# Patient Record
Sex: Female | Born: 1985
Health system: Southern US, Community
[De-identification: ages and names within clinical notes are randomized; demographics above are authoritative.]

## PROBLEM LIST (undated history)

## (undated) DIAGNOSIS — R51 Headache: Secondary | ICD-10-CM

## (undated) DIAGNOSIS — C4490 Unspecified malignant neoplasm of skin, unspecified: Secondary | ICD-10-CM

## (undated) DIAGNOSIS — F909 Attention-deficit hyperactivity disorder, unspecified type: Secondary | ICD-10-CM

## (undated) DIAGNOSIS — R519 Headache, unspecified: Secondary | ICD-10-CM

## (undated) DIAGNOSIS — T8859XA Other complications of anesthesia, initial encounter: Secondary | ICD-10-CM

## (undated) DIAGNOSIS — T4145XA Adverse effect of unspecified anesthetic, initial encounter: Secondary | ICD-10-CM

## (undated) HISTORY — PX: ADENOIDECTOMY: SHX5191

## (undated) HISTORY — DX: Unspecified malignant neoplasm of skin, unspecified: C44.90

---
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
## 2005-02-28 ENCOUNTER — Other Ambulatory Visit: Admission: RE | Admit: 2005-02-28 | Discharge: 2005-02-28 | Payer: Self-pay | Admitting: Obstetrics and Gynecology

## 2011-07-01 ENCOUNTER — Ambulatory Visit: Payer: Self-pay | Admitting: Family Medicine

## 2011-07-01 ENCOUNTER — Encounter: Payer: Self-pay | Admitting: Family

## 2011-07-01 ENCOUNTER — Ambulatory Visit (INDEPENDENT_AMBULATORY_CARE_PROVIDER_SITE_OTHER): Payer: BC Managed Care – PPO | Admitting: Family

## 2011-07-01 VITALS — BP 102/80 | HR 84 | Temp 98.3°F | Resp 16 | Ht 62.5 in | Wt 136.0 lb

## 2011-07-01 DIAGNOSIS — R55 Syncope and collapse: Secondary | ICD-10-CM

## 2011-07-01 DIAGNOSIS — R002 Palpitations: Secondary | ICD-10-CM

## 2011-07-01 DIAGNOSIS — F329 Major depressive disorder, single episode, unspecified: Secondary | ICD-10-CM | POA: Insufficient documentation

## 2011-07-01 DIAGNOSIS — F32A Depression, unspecified: Secondary | ICD-10-CM

## 2011-07-01 MED ORDER — BUPROPION HCL ER (XL) 150 MG PO TB24: 150.0000 mg | ORAL_TABLET | Freq: Every day | ORAL | Status: DC

## 2011-07-01 NOTE — Patient Instructions (Signed)
Please complete your blood work prior to leaving today.  Eat 3 well balanced meals and two snacks a day. Try to eliminate alcohol. Follow up in 1 month. Welcome to [facility]!

## 2011-07-01 NOTE — Assessment & Plan Note (Signed)
Did not tolerate zoloft due to sedation. Will give trial of Wellbutrin. She was instructed to start one tablet daily for first 3 days then increase to BID. We discussed importance of stopping alcohol and we also discussed the possibility of her attending AA meetings.  Suggested that she bring a supportive friend with her to these meetings. Hopefully, if depression can be better controlled, it will be easier for her to stop drinking.  Plan follow up in 1 month.

## 2011-07-01 NOTE — Assessment & Plan Note (Addendum)
Will obtain BMET, CBC and TSH. EKG performed in the office today is personally reviewed and it notes NSR without arrythmia.  I counseled the patient on healthy nutrition and need to eat 3 healthy meals a day.  Also, cut back on caffeine.  If recurrent episodes, will consider referral for holter monitor.  I suspect that these episodes are vasovagal in nature.

## 2011-07-01 NOTE — Progress Notes (Signed)
Subjective:    Patient ID: Kristin Sandoval, female    DOB: 11-25-1985, 26 y.o.   MRN: [id number]  HPI  Ms.  Kristin Sandoval is a 26 yr old female new to practice.  Has not had a primary care.  She has seen Dr. Edward Jolly at [facility]. She has several concerns today.    Near syncope- 12/31 went to the ED with a friend who was ill.  Had episode of palpitations, sweating. Took her about 30 minutes for her heart rate to feel like it normalized. She put her head between her knees.  Happened again last night.  She has never completely passed out before.  She denies palpitations on a regular basis.  She denies associated shortness of breath or chest pain during the episode.  She does report intermittent nausea.  She drinks coffee in the AM, then has several caffienated soft drinks later in the day. She tells me that when she was a child she used to have similar near syncopal events- often occuring in the shower.    Diet- She reports that she was as high as 160 lbs about 6 months ago.  She reports that she often skips meals.  She does pilates at home. Strength training.  No cardio.  Often skips meals,  Keeps calorie count very low- usually around a thousand calories a day.  Depression/anxiety- has been on paxil in the past.  Stopped zoloft 2 weeks ago.  Felt lethargic- was taking zoloft 25 mg every other day for 4-6 weeks.  She notes that at first it made her feel "elated."  This was prescribed by her OB/GYN. Stays up late, sleeps late.    Alcohol use- She tells me that she "drinks too much and I know it is not healthy."  She drinks Crown [place] and tells me that although she has had "some help," she has drank about 5 bottles of Crown Royal since November.  Reports that she feels guilty about her drinking.  Occasionally will come home for lunch and have a drink.  Drinks because it makes me feel happy, "less bitchy", nicer to be around.  Tells me that her father and several family members on her father's side had  history of alcoholism. She said that she "put her mind to it to stop drinking" when she started zoloft.  She was unable to stop. She has seen a therapist a few times, but she is not sure that this is helping her.    Review of Systems See HPI  Past Medical History  Diagnosis Date  . Asthma     childhood    History   Social History  . Marital Status: Single    Spouse Name: N/A    Number of Children: 0  . Years of Education: N/A   Occupational History  .  [employer]   Social History Main Topics  . Smoking status: Former Smoker    Types: Cigarettes    Quit date: 06/30/2006  . Smokeless tobacco: Not on file  . Alcohol Use: Yes     0-7 drinks weekly.  . Drug Use: No  . Sexually Active: Not on file   Other Topics Concern  . Not on file   Social History Narrative   Caffeine Use:  1 cup coffee dailyRegular exercise:  4 x weekly    Past Surgical History  Procedure Date  . Adenoidectomy     26 yrs old    Family History  Problem Relation Age of  Onset  . Alcohol abuse Father   . Arthritis Maternal Uncle   . Hyperlipidemia Maternal Uncle   . Alcohol abuse Paternal Aunt   . Arthritis Maternal Grandmother     DDD, spinal stenosis  . Hyperlipidemia Maternal Grandmother   . Alcohol abuse Maternal Grandfather   . Alcohol abuse Paternal Grandfather     No Known Allergies  No current outpatient prescriptions on file prior to visit.    BP 102/80  Pulse 84  Temp(Src) 98.3 F (36.8 C) (Oral)  Resp 16  Ht 5' 2.5" (1.588 m)  Wt 136 lb (61.689 kg)  BMI 24.48 kg/m2  LMP 07/01/2011       Objective:   Physical Exam  Constitutional: She appears well-developed and well-nourished. No distress.  HENT:  Head: Normocephalic and atraumatic.  Eyes: No scleral icterus.  Cardiovascular: Normal rate and regular rhythm.   No murmur heard. Pulmonary/Chest: Effort normal and breath sounds normal. No respiratory distress. She has no wheezes. She has no rales. She exhibits no  tenderness.  Musculoskeletal: She exhibits no edema.  Skin: Skin is warm and dry.  Psychiatric: She has a normal mood and affect. Her behavior is normal. Judgment and thought content normal.          Assessment & Plan:  45 minutes spent with pt today.  >50% of this time was spent counseling pt on her depression, nutrition and alcohol use.

## 2011-07-02 LAB — BASIC METABOLIC PANEL WITH GFR
CO2: 25 mEq/L (ref 19–32)
Calcium: 9.4 mg/dL (ref 8.4–10.5)
GFR, Est African American: >89 mL/min
Glucose, Bld: 74 mg/dL (ref 70–99)
Potassium: 4.3 mEq/L (ref 3.5–5.3)
Sodium: 138 mEq/L (ref 135–145)

## 2011-07-02 LAB — CBC WITH DIFFERENTIAL/PLATELET
Hemoglobin: 13.3 g/dL (ref 12.0–15.0)
Lymphocytes Relative: 36 % (ref 12–46)
Lymphs Abs: 3.4 10*3/uL (ref 0.7–4.0)
MCH: 30 pg (ref 26.0–34.0)
Monocytes Relative: 7 % (ref 3–12)
Neutrophils Relative %: 54 % (ref 43–77)
Platelets: 293 10*3/uL (ref 150–400)
RBC: 4.43 MIL/uL (ref 3.87–5.11)
WBC: 9.5 10*3/uL (ref 4.0–10.5)

## 2011-07-02 LAB — TSH: TSH: 1.539 u[IU]/mL (ref 0.350–4.500)

## 2011-07-04 ENCOUNTER — Encounter: Payer: Self-pay | Admitting: Family

## 2011-07-21 ENCOUNTER — Telehealth: Payer: Self-pay | Admitting: *Deleted

## 2011-07-21 NOTE — Telephone Encounter (Signed)
Pt called stating she has felt nauseous x 5-6 days.  Feels jittery, thought it was coming from some cold med she took.  Pt has been very stressed, has not been eating. Denies suicidal thoughts.  Anxiety attacks are not as intense since starting Wellbutrin. Pt had a lot of sinus drainage that seems to be improving.  LMP 07/01/11. Has GYN appt tomorrow morning. Pt wants to know if this can be a side effect of the Wellbutrin or what do you think could be causing the nausea? Pt can be reached tomorrow after noon or we can leave a detailed message on her cell.

## 2011-07-22 NOTE — Telephone Encounter (Signed)
Nausea could be due to Wellbutrin, "jittery feeling" more likely from cold med.  I would like to see her back in the office please.

## 2011-07-22 NOTE — Telephone Encounter (Signed)
Left detailed message on cell voicemail and to call to schedule appt with Melissa.

## 2011-07-27 NOTE — Telephone Encounter (Signed)
Attempted to reach pt to check on her status and received recording that voice mailbox was full. Encounter is being closed.

## 2011-09-19 ENCOUNTER — Telehealth: Payer: Self-pay | Admitting: Family

## 2011-09-19 MED ORDER — BUPROPION HCL ER (XL) 150 MG PO TB24: 150.0000 mg | ORAL_TABLET | Freq: Every day | ORAL | Status: DC

## 2011-09-19 NOTE — Telephone Encounter (Signed)
Will send 30 day supply of bupropion until pt is seen for follow up in the office as pt is past due for follow up. Attempted to notify pt and reached message that voice mailbox is full and cannot accept messages. 30 day supply sent to pharmacy x no refills.

## 2011-09-19 NOTE — Telephone Encounter (Signed)
90 day prescription request  Bupropion hcl xl 150mg  tablet.

## 2011-09-20 ENCOUNTER — Ambulatory Visit (INDEPENDENT_AMBULATORY_CARE_PROVIDER_SITE_OTHER): Payer: BC Managed Care – PPO | Admitting: Family

## 2011-09-20 ENCOUNTER — Encounter: Payer: Self-pay | Admitting: Family

## 2011-09-20 VITALS — BP 118/78 | HR 96 | Temp 98.1°F | Resp 16 | Ht 62.5 in | Wt 129.0 lb

## 2011-09-20 DIAGNOSIS — F329 Major depressive disorder, single episode, unspecified: Secondary | ICD-10-CM

## 2011-09-20 DIAGNOSIS — F3289 Other specified depressive episodes: Secondary | ICD-10-CM

## 2011-09-20 DIAGNOSIS — R55 Syncope and collapse: Secondary | ICD-10-CM

## 2011-09-20 DIAGNOSIS — R41840 Attention and concentration deficit: Secondary | ICD-10-CM

## 2011-09-20 DIAGNOSIS — F909 Attention-deficit hyperactivity disorder, unspecified type: Secondary | ICD-10-CM | POA: Insufficient documentation

## 2011-09-20 DIAGNOSIS — F32A Depression, unspecified: Secondary | ICD-10-CM

## 2011-09-20 MED ORDER — BUPROPION HCL ER (XL) 150 MG PO TB24: 150.0000 mg | ORAL_TABLET | Freq: Every day | ORAL | Status: DC

## 2011-09-20 NOTE — Patient Instructions (Signed)
You will be contacted about your referral to ADD testing.  Follow up 1-2 weeks after your appointment with psychology for ADD testing.

## 2011-09-20 NOTE — Progress Notes (Signed)
  Subjective:    Patient ID: Kristin Sandoval, female    DOB: 11/21/1985, 26 y.o.   MRN: [id number]  HPI  Kristin Sandoval is a 26 yr old female who presents today for follow up.   1) Depression- reports that her mood is greatly improved on the Wellbutrin.  Tells me that she is no longer craving alcohol.  Used to drink excessively nearly every day.  Now only drinking on occasion and generally in social settings.  She is happy about this improvement.  Had nausea initially, but this has resolved.  2) Concentration problems-  Notes trouble focusing especially at work or when reading.  Reports that she was diagnosed with ADD as a child but was never treated.  Feels that these symptoms have intensified since she started on Wellbutrin.   3) Dizziness/palpatiation- reports resolution of dizziness.  She reports reports 2 incidents of brief palpitations since her last visit.    Review of Systems See HPI  Past Medical History  Diagnosis Date  . Asthma     childhood    History   Social History  . Marital Status: Single    Spouse Name: N/A    Number of Children: 0  . Years of Education: N/A   Occupational History  .  [employer]   Social History Main Topics  . Smoking status: Former Smoker    Types: Cigarettes    Quit date: 06/30/2006  . Smokeless tobacco: Not on file  . Alcohol Use: Yes     0-7 drinks weekly.  . Drug Use: No  . Sexually Active: Not on file   Other Topics Concern  . Not on file   Social History Narrative   Caffeine Use:  1 cup coffee dailyRegular exercise:  4 x weekly    Past Surgical History  Procedure Date  . Adenoidectomy     26 yrs old    Family History  Problem Relation Age of Onset  . Alcohol abuse Father   . Arthritis Maternal Uncle   . Hyperlipidemia Maternal Uncle   . Alcohol abuse Paternal Aunt   . Arthritis Maternal Grandmother     DDD, spinal stenosis  . Hyperlipidemia Maternal Grandmother   . Alcohol abuse Maternal Grandfather   . Alcohol abuse  Paternal Grandfather     No Known Allergies  Current Outpatient Prescriptions on File Prior to Visit  Medication Sig Dispense Refill  . ORTHO TRI-CYCLEN LO 0.18/0.215/0.25 MG-25 MCG tablet Take 1 tablet by mouth daily.      Marland Kitchen DISCONTD: buPROPion (WELLBUTRIN XL) 150 MG 24 hr tablet Take 1 tablet (150 mg total) by mouth daily.  30 tablet  0    BP 118/78  Pulse 96  Temp(Src) 98.1 F (36.7 C) (Oral)  Resp 16  Ht 5' 2.5" (1.588 m)  Wt 129 lb (58.514 kg)  BMI 23.22 kg/m2  SpO2 99%       Objective:   Physical Exam  Constitutional: She appears well-developed and well-nourished. No distress.  Psychiatric: She has a normal mood and affect. Her behavior is normal. Judgment and thought content normal.          Assessment & Plan:  25 minutes spent with pt.  >50% of this time was spent counseling pt on her depression/possible ADD.

## 2011-09-20 NOTE — Assessment & Plan Note (Signed)
I commended her on cutting back on her alcohol.  Depression is improved.  Still some mild anxiety at times.  Will continue current dose of wellbutrin.

## 2011-09-20 NOTE — Assessment & Plan Note (Signed)
I suspect Adult ADD.  Will send her for a formal evaluation.

## 2011-09-20 NOTE — Assessment & Plan Note (Signed)
No further symptoms. Resolved.  I suspect rare palpitations are anxiety related.  EKG, TSH, CBC, BMET all normal last visit. Monitor.

## 2011-10-03 ENCOUNTER — Encounter: Payer: Self-pay | Admitting: Family

## 2011-10-03 ENCOUNTER — Telehealth: Payer: Self-pay | Admitting: Family

## 2011-10-03 NOTE — Telephone Encounter (Signed)
Pls call pt and let her know that have reviewed her consultation with psychology and they believe that she does have ADHD.  She should schedule a follow up appointment at her convenience for further treatment discussion.

## 2011-10-04 NOTE — Telephone Encounter (Signed)
Attempted to reach pt and was unable to leave message as voice mailbox was full. Letter mailed to pt.

## 2011-10-12 ENCOUNTER — Ambulatory Visit (INDEPENDENT_AMBULATORY_CARE_PROVIDER_SITE_OTHER): Payer: BC Managed Care – PPO | Admitting: Family

## 2011-10-12 ENCOUNTER — Encounter: Payer: Self-pay | Admitting: Family

## 2011-10-12 VITALS — BP 100/78 | HR 88 | Temp 98.2°F | Resp 16 | Ht 62.5 in | Wt 131.0 lb

## 2011-10-12 DIAGNOSIS — F909 Attention-deficit hyperactivity disorder, unspecified type: Secondary | ICD-10-CM

## 2011-10-12 MED ORDER — AMPHETAMINE-DEXTROAMPHET ER 20 MG PO CP24: 20.0000 mg | ORAL_CAPSULE | ORAL | Status: DC

## 2011-10-12 NOTE — Progress Notes (Signed)
  Subjective:    Patient ID: Kristin Sandoval, female    DOB: [birth year], 26 y.o.   MRN: [id number]  HPI  Ms.  Kristin Sandoval is a 26 yr old female who presents today for follow up. Had formal evaluation 09/28/11 At [facility]- findings suggestive of ADHD. She reports that she continues to have difficulty concentrating at work and this frustrates her.  Feels like it may contribute to her mood and irritability.    She continues to feel that her depression is improved on wellbutrin.     Review of Systems     Objective:   Physical Exam  Constitutional: She appears well-developed and well-nourished. No distress.  Psychiatric: She has a normal mood and affect. Her behavior is normal. Judgment and thought content normal.          Assessment & Plan:

## 2011-10-12 NOTE — Patient Instructions (Signed)
Please schedule a follow up appointment in 1 month.

## 2011-10-13 ENCOUNTER — Telehealth: Payer: Self-pay | Admitting: *Deleted

## 2011-10-13 NOTE — Telephone Encounter (Signed)
Received form from [facility] for prior auth on pt's adderall XR rx. Form completed and forwarded to Provider for signature.

## 2011-10-13 NOTE — Assessment & Plan Note (Signed)
Trial of Adderall.  Pt was advised that this medication has the potential for abuse.  We discussed proper use of medication.  Controlled substance contract was signed and common side effects discussed.  Follow up in 1 month.  15 minutes spent with the patient today.  >50 % of this time was spent counseling pt on her ADHD and treatment.

## 2011-10-14 NOTE — Telephone Encounter (Signed)
Form faxed to [facility] [phone]. Awaiting approval / denial status. Attempted to notify pt and reached recording that voice mail box cannot accept messages.

## 2011-10-14 NOTE — Telephone Encounter (Signed)
Received approval from 10/14/11 through 10/12/12. Notified pharmacy and pt.

## 2011-10-14 NOTE — Telephone Encounter (Signed)
Notified pt of prior auth process and that we will contact her and the pharmacy once we have received the ins. Determination.

## 2011-10-14 NOTE — Telephone Encounter (Signed)
Signed.

## 2011-11-07 ENCOUNTER — Encounter: Payer: Self-pay | Admitting: Family

## 2011-11-07 ENCOUNTER — Ambulatory Visit (INDEPENDENT_AMBULATORY_CARE_PROVIDER_SITE_OTHER): Payer: BC Managed Care – PPO | Admitting: Family

## 2011-11-07 VITALS — BP 110/70 | HR 115 | Temp 98.3°F | Resp 16 | Ht 62.5 in | Wt 122.1 lb

## 2011-11-07 DIAGNOSIS — F32A Depression, unspecified: Secondary | ICD-10-CM

## 2011-11-07 DIAGNOSIS — F909 Attention-deficit hyperactivity disorder, unspecified type: Secondary | ICD-10-CM

## 2011-11-07 DIAGNOSIS — F329 Major depressive disorder, single episode, unspecified: Secondary | ICD-10-CM

## 2011-11-07 MED ORDER — AMPHETAMINE-DEXTROAMPHET ER 20 MG PO CP24: 20.0000 mg | ORAL_CAPSULE | ORAL | Status: DC

## 2011-11-07 MED ORDER — BUPROPION HCL 75 MG PO TABS: ORAL_TABLET | ORAL | Status: DC

## 2011-11-07 NOTE — Assessment & Plan Note (Signed)
Improved. I am concerned about her weight loss and have asked the pt to continue to monitor her weight closely at home and let me know if she has any further weight loss. Cont. Current dose of adderall. 35 minutes spent with pt today. Greater than 50 percent of this time was spent counseling pt on her depression and adhd.

## 2011-11-07 NOTE — Patient Instructions (Signed)
Please follow up in 1 month.  

## 2011-11-07 NOTE — Progress Notes (Signed)
  Subjective:    Patient ID: Kristin Sandoval, female    DOB: 1985-07-05, 26 y.o.   MRN: [id number]  HPI Kristin Sandoval is a 26 year old female who presents today for follow up of her adhd. She reports that the first week that she started adderral, she felt very flat. Noted anorexia. She has lost 11 pounds since her last visit. She feels like her appetite is slowly improving. Reports that last week she was very productive at work. Thinks she is better able to prioritize.  Depression- she continues the wellbutrin. Has nearly discontinued alcohol. She is now seeing a therapist which she thinks is helping. Less stress in her personal life. She wants to discontinue wellbutrin. Review of Systems See hpi    Objective:   Physical Exam  Constitutional: She appears well-developed and well-nourished.  Psychiatric: She has a normal mood and affect. Her behavior is normal. Judgment and thought content normal.          Assessment & Plan:

## 2011-11-26 ENCOUNTER — Other Ambulatory Visit: Payer: Self-pay | Admitting: Family

## 2011-11-28 NOTE — Telephone Encounter (Signed)
Pt wanted to stop bupropion at last visit. Is she requesting refill? Unable to reach pt or leave message on cell #. Left message on work # to return my call.

## 2011-11-29 NOTE — Telephone Encounter (Signed)
Unable to leave message on home/cell#, left message on work # to return my call.

## 2011-11-30 NOTE — Telephone Encounter (Signed)
Bupropion refill denied. Pt has not returned my calls.

## 2011-11-30 NOTE — Telephone Encounter (Signed)
Ok to refuse refill.  She is weaning off.

## 2011-12-27 ENCOUNTER — Ambulatory Visit (INDEPENDENT_AMBULATORY_CARE_PROVIDER_SITE_OTHER): Payer: BC Managed Care – PPO | Admitting: Family

## 2011-12-27 ENCOUNTER — Encounter: Payer: Self-pay | Admitting: Family

## 2011-12-27 VITALS — BP 108/68 | HR 83 | Temp 98.3°F | Resp 16 | Ht 62.5 in | Wt 120.0 lb

## 2011-12-27 DIAGNOSIS — F329 Major depressive disorder, single episode, unspecified: Secondary | ICD-10-CM

## 2011-12-27 DIAGNOSIS — F32A Depression, unspecified: Secondary | ICD-10-CM

## 2011-12-27 DIAGNOSIS — F909 Attention-deficit hyperactivity disorder, unspecified type: Secondary | ICD-10-CM

## 2011-12-27 MED ORDER — AMPHETAMINE-DEXTROAMPHET ER 20 MG PO CP24: 20.0000 mg | ORAL_CAPSULE | ORAL | Status: DC

## 2011-12-27 NOTE — Progress Notes (Signed)
  Subjective:    Patient ID: Kristin Sandoval, female    DOB: 1986/05/06, 26 y.o.   MRN: [id number]  HPI  Kristin Sandoval is a 26 yr old female who presents today for follow up of her depression.  Last visit she discontinued wellbutrin due to an improvement in her symptoms and weight loss.  She reports that since discontinuing wellbutrin, she has felt "happy."  When asked about alcohol consumption, she notes that she has started drinking a little more since she stopped the medication.  She has not been drinking during the week, but has gone out socially a few times and drank quite a bit.    ADHD- reports that she is feeling better.  Feels like the Adderall is working. Still has to force herself to eat as the adderall has curbed her appetite.   Review of Systems See HPI  Past Medical History  Diagnosis Date  . Asthma     childhood    History   Social History  . Marital Status: Single    Spouse Name: N/A    Number of Children: 0  . Years of Education: N/A   Occupational History  .  [employer]   Social History Main Topics  . Smoking status: Former Smoker    Types: Cigarettes    Quit date: 06/30/2006  . Smokeless tobacco: Not on file  . Alcohol Use: Yes     0-7 drinks weekly.  . Drug Use: No  . Sexually Active: Not on file   Other Topics Concern  . Not on file   Social History Narrative   Caffeine Use:  1 cup coffee dailyRegular exercise:  4 x weekly    Past Surgical History  Procedure Date  . Adenoidectomy     26 yrs old    Family History  Problem Relation Age of Onset  . Alcohol abuse Father   . Arthritis Maternal Uncle   . Hyperlipidemia Maternal Uncle   . Alcohol abuse Paternal Aunt   . Arthritis Maternal Grandmother     DDD, spinal stenosis  . Hyperlipidemia Maternal Grandmother   . Alcohol abuse Maternal Grandfather   . Alcohol abuse Paternal Grandfather     Allergies  Allergen Reactions  . Acrylic Polymer (Carbomer)     Loses nail  . Adhesive (Tape) Rash    . Mango Flavor Rash    Current Outpatient Prescriptions on File Prior to Visit  Medication Sig Dispense Refill  . ORTHO TRI-CYCLEN LO 0.18/0.215/0.25 MG-25 MCG tablet Take 1 tablet by mouth daily.      Marland Kitchen DISCONTD: amphetamine-dextroamphetamine (ADDERALL XR) 20 MG 24 hr capsule Take 1 capsule (20 mg total) by mouth every morning.  30 capsule  0    BP 108/68  Pulse 83  Temp 98.3 F (36.8 C) (Oral)  Resp 16  Ht 5' 2.5" (1.588 m)  Wt 120 lb (54.432 kg)  BMI 21.60 kg/m2  SpO2 99%       Objective:   Physical Exam  Constitutional: She appears well-developed and well-nourished. No distress.  Psychiatric: She has a normal mood and affect. Her behavior is normal. Judgment and thought content normal.          Assessment & Plan:

## 2011-12-27 NOTE — Assessment & Plan Note (Signed)
Improved on adderall. She will keep a close eye on her weight and call me if she develops any further weight loss.  She may wish to try strattera instead at some point and will let me know.

## 2011-12-27 NOTE — Patient Instructions (Addendum)
Please schedule a follow up appointment in 3 months.

## 2011-12-27 NOTE — Assessment & Plan Note (Signed)
Seems stable.  Weight has improved off of Wellbutrin.  We did discuss the importance of limiting her alcohol intake and she verbalizes understanding.

## 2012-01-30 ENCOUNTER — Telehealth: Payer: Self-pay | Admitting: *Deleted

## 2012-01-30 MED ORDER — AMPHETAMINE-DEXTROAMPHET ER 20 MG PO CP24: 20.0000 mg | ORAL_CAPSULE | ORAL | Status: DC

## 2012-01-30 NOTE — Telephone Encounter (Signed)
Adderall rx last printed on 12/27/11. Pt will be due for follow up in October and is requesting refill. Rx printed and forwarded to Provider for signature.

## 2012-01-30 NOTE — Telephone Encounter (Signed)
Signed.

## 2012-01-30 NOTE — Telephone Encounter (Signed)
Rx given to pt. 

## 2012-03-06 ENCOUNTER — Telehealth: Payer: Self-pay | Admitting: *Deleted

## 2012-03-06 MED ORDER — AMPHETAMINE-DEXTROAMPHET ER 20 MG PO CP24: 20.0000 mg | ORAL_CAPSULE | ORAL | Status: DC

## 2012-03-06 NOTE — Telephone Encounter (Signed)
Signed.

## 2012-03-06 NOTE — Telephone Encounter (Signed)
Rx placed at front desk for pick up, pt notified

## 2012-03-06 NOTE — Telephone Encounter (Signed)
Pt left message requesting refill of Adderall XR. Rx last printed on 01/30/12 and pt will be due for follow up in October. Rx printed and forwarded to Provider for signature.

## 2012-04-09 ENCOUNTER — Other Ambulatory Visit: Payer: Self-pay | Admitting: *Deleted

## 2012-04-09 MED ORDER — AMPHETAMINE-DEXTROAMPHET ER 20 MG PO CP24: 20.0000 mg | ORAL_CAPSULE | ORAL | Status: DC

## 2012-04-09 NOTE — Telephone Encounter (Signed)
Informed patient of that Rx has been printed and sent to provider for signature. Patient did schedule a follow up for 04/10/12

## 2012-04-09 NOTE — Telephone Encounter (Signed)
Pt left message requesting refill of Adderall. Rx last printed 03/06/12 and pt is due for follow up this month. Rx printed and forwarded to PRovider for signature. Please call pt to arrange f/u as further refills will not be provided until pt is seen.

## 2012-04-10 ENCOUNTER — Telehealth: Payer: Self-pay | Admitting: *Deleted

## 2012-04-10 ENCOUNTER — Encounter: Payer: Self-pay | Admitting: Family

## 2012-04-10 ENCOUNTER — Ambulatory Visit (INDEPENDENT_AMBULATORY_CARE_PROVIDER_SITE_OTHER): Payer: BC Managed Care – PPO | Admitting: Family

## 2012-04-10 VITALS — BP 100/70 | HR 101 | Temp 98.7°F | Resp 16 | Ht 62.5 in | Wt 123.1 lb

## 2012-04-10 DIAGNOSIS — F3289 Other specified depressive episodes: Secondary | ICD-10-CM

## 2012-04-10 DIAGNOSIS — K649 Unspecified hemorrhoids: Secondary | ICD-10-CM

## 2012-04-10 DIAGNOSIS — F32A Depression, unspecified: Secondary | ICD-10-CM

## 2012-04-10 DIAGNOSIS — F909 Attention-deficit hyperactivity disorder, unspecified type: Secondary | ICD-10-CM

## 2012-04-10 DIAGNOSIS — F329 Major depressive disorder, single episode, unspecified: Secondary | ICD-10-CM

## 2012-04-10 MED ORDER — ATOMOXETINE HCL 40 MG PO CAPS: ORAL_CAPSULE | ORAL | Status: DC

## 2012-04-10 MED ORDER — HYDROCORTISONE ACE-PRAMOXINE 1-1 % RE FOAM: 1.0000 | Freq: Two times a day (BID) | RECTAL | Status: DC

## 2012-04-10 NOTE — Patient Instructions (Addendum)
Try to keep stools soft. Drink 6-8 glasses of water a day. You may try miralax one cap in water once daily or add a fiber supplement such as metamucil wafers once daily.  Please schedule a follow up appointment in 1 month.

## 2012-04-10 NOTE — Telephone Encounter (Signed)
Left message on cell# to return my call. Will give rx to pt at f/u this afternoon.

## 2012-04-10 NOTE — Assessment & Plan Note (Signed)
Stop adderall, start strattera.

## 2012-04-10 NOTE — Telephone Encounter (Signed)
Returned call to pt. Advised pt that no matter where she takes her rx, insurance will require authorization. Pt asked if she could go ahead and refill her Adderal rx until authorization comes through for the Strattera. Asked Melissa and she said yes. Notified pt ok to refill the Adderal.

## 2012-04-10 NOTE — Progress Notes (Signed)
Subjective:    Patient ID: Kristin Sandoval, female    DOB: 13-May-1986, 26 y.o.   MRN: [id number]  HPI  Kristin Sandoval is a 26 yr old female who presents today for follow up.  1) Depression- she reports that she has had some anxiety which she relates to "man problems." Otherwise has been doing well.  2) ADHD- reports that the adderall is helping her to have concentration with work and school.  She continues to have issues with anorexia while taking.  She is interested in switching to strattera.  3) Hemorrhoids- reports that she has struggled with hemorrhoids "for a while." She has used otc preps with minimal relief.  Reports that her stool is often hard and she knows that she does not drink enough water.    Review of Systems See HPI  Past Medical History  Diagnosis Date  . Asthma     childhood    History   Social History  . Marital Status: Single    Spouse Name: N/A    Number of Children: 0  . Years of Education: N/A   Occupational History  .  [employer]   Social History Main Topics  . Smoking status: Former Smoker    Types: Cigarettes    Quit date: 06/30/2006  . Smokeless tobacco: Not on file  . Alcohol Use: Yes     0-7 drinks weekly.  . Drug Use: No  . Sexually Active: Not on file   Other Topics Concern  . Not on file   Social History Narrative   Caffeine Use:  1 cup coffee dailyRegular exercise:  4 x weekly    Past Surgical History  Procedure Date  . Adenoidectomy     26 yrs old    Family History  Problem Relation Age of Onset  . Alcohol abuse Father   . Arthritis Maternal Uncle   . Hyperlipidemia Maternal Uncle   . Alcohol abuse Paternal Aunt   . Arthritis Maternal Grandmother     DDD, spinal stenosis  . Hyperlipidemia Maternal Grandmother   . Alcohol abuse Maternal Grandfather   . Alcohol abuse Paternal Grandfather     Allergies  Allergen Reactions  . Acrylic Polymer (Carbomer)     Loses nail  . Adhesive (Tape) Rash  . Mango Flavor Rash     Current Outpatient Prescriptions on File Prior to Visit  Medication Sig Dispense Refill  . ORTHO TRI-CYCLEN LO 0.18/0.215/0.25 MG-25 MCG tablet Take 1 tablet by mouth daily.      Marland Kitchen tretinoin (RETIN-A) 0.05 % cream Apply topically as needed.      Marland Kitchen acyclovir ointment (ZOVIRAX) 5 % 1 application every 3 (three) hours. Every 3 hours as needed      . atomoxetine (STRATTERA) 40 MG capsule 40 mg by mouth once daily for 3 days, then increase to 80 mg once daily.  60 capsule  0    BP 100/70  Pulse 101  Temp 98.7 F (37.1 C) (Oral)  Resp 16  Ht 5' 2.5" (1.588 m)  Wt 123 lb 1.3 oz (55.829 kg)  BMI 22.15 kg/m2  SpO2 99%  LMP 04/03/2012       Objective:   Physical Exam  Constitutional: She is oriented to person, place, and time. She appears well-developed and well-nourished. No distress.  Musculoskeletal: She exhibits no edema.  Neurological: She is alert and oriented to person, place, and time.  Psychiatric: She has a normal mood and affect. Her behavior is normal. Judgment and thought  content normal.          Assessment & Plan:  25 minutes spent with pt today.  >50% of this time was spent counseling pt on adhd, hemorrhoids and depression/anxiety.

## 2012-04-10 NOTE — Assessment & Plan Note (Signed)
Seems stable at this time.  Monitor.

## 2012-04-10 NOTE — Assessment & Plan Note (Signed)
We discussed importance of adequate hydration and keeping stools soft with either miralax or a fiber supplement.  Add proctofoam.  We will re-evaluate in 1 month at her physical.

## 2012-04-10 NOTE — Telephone Encounter (Signed)
Pt called stating there is going to be a delay in getting the Strattera filled because [facility] is waiting for [facility] from ins co. Pt states she wants to see if she can get it filled at [facility]. Please advise

## 2012-04-13 ENCOUNTER — Telehealth: Payer: Self-pay | Admitting: *Deleted

## 2012-04-13 NOTE — Telephone Encounter (Signed)
Signed.

## 2012-04-13 NOTE — Telephone Encounter (Signed)
Form faxed to [facility] @ [phone]. Awaiting approval/denial status.

## 2012-04-13 NOTE — Telephone Encounter (Signed)
Received fax from [facility] that Strattera is requiring prior authorization. Form completed and forwarded to Provider for signature.

## 2012-04-17 NOTE — Telephone Encounter (Signed)
Spoke with Crystella at [facility] [phone] and was notified that Strattera had been approved for 36 months through 04/14/15. [facility] at [facility] and verified that rx was filled on 04/13/12. Spoke with pt and verified receipt of strattera. States that she did pick up adderall on 04/10/12 but has stopped it. She just started Strattera today. Pt also states that she received 2 bottles of Strattera from the pharmacy, advised pt to check bottles at home and call me tomorrow with confirmation.

## 2012-05-09 ENCOUNTER — Telehealth: Payer: Self-pay | Admitting: *Deleted

## 2012-05-09 ENCOUNTER — Encounter: Payer: Self-pay | Admitting: Family

## 2012-05-09 ENCOUNTER — Ambulatory Visit (INDEPENDENT_AMBULATORY_CARE_PROVIDER_SITE_OTHER): Payer: BC Managed Care – PPO | Admitting: Family

## 2012-05-09 VITALS — BP 110/82 | HR 121 | Temp 99.0°F | Resp 16 | Wt 122.1 lb

## 2012-05-09 DIAGNOSIS — N390 Urinary tract infection, site not specified: Secondary | ICD-10-CM

## 2012-05-09 DIAGNOSIS — R300 Dysuria: Secondary | ICD-10-CM

## 2012-05-09 DIAGNOSIS — F909 Attention-deficit hyperactivity disorder, unspecified type: Secondary | ICD-10-CM

## 2012-05-09 DIAGNOSIS — Z0000 Encounter for general adult medical examination without abnormal findings: Secondary | ICD-10-CM | POA: Insufficient documentation

## 2012-05-09 LAB — CBC WITH DIFFERENTIAL/PLATELET
Eosinophils Absolute: 0.1 10*3/uL (ref 0.0–0.7)
Hemoglobin: 13.3 g/dL (ref 12.0–15.0)
Lymphocytes Relative: 23 % (ref 12–46)
Lymphs Abs: 1.7 10*3/uL (ref 0.7–4.0)
MCH: 29.5 pg (ref 26.0–34.0)
Monocytes Relative: 13 % — ABNORMAL HIGH (ref 3–12)
Neutrophils Relative %: 63 % (ref 43–77)
Platelets: 264 10*3/uL (ref 150–400)
RBC: 4.51 MIL/uL (ref 3.87–5.11)
WBC: 7.6 10*3/uL (ref 4.0–10.5)

## 2012-05-09 LAB — POCT URINALYSIS DIPSTICK
Nitrite, UA: NEGATIVE
Protein, UA: NEGATIVE
Urobilinogen, UA: 0.2
pH, UA: 7

## 2012-05-09 LAB — LIPID PANEL
Cholesterol: 162 mg/dL (ref 0–200)
LDL Cholesterol: 88 mg/dL (ref 0–99)
Triglycerides: 80 mg/dL (ref <150)
VLDL: 16 mg/dL (ref 0–40)

## 2012-05-09 LAB — HEPATIC FUNCTION PANEL
ALT: 12 U/L (ref 0–35)
AST: 14 U/L (ref 0–37)
Indirect Bilirubin: 0.4 mg/dL (ref 0.0–0.9)
Total Protein: 7.3 g/dL (ref 6.0–8.3)

## 2012-05-09 MED ORDER — CIPROFLOXACIN HCL 500 MG PO TABS: 500.0000 mg | ORAL_TABLET | Freq: Two times a day (BID) | ORAL | Status: DC

## 2012-05-09 MED ORDER — AMPHETAMINE-DEXTROAMPHET ER 20 MG PO CP24: 20.0000 mg | ORAL_CAPSULE | Freq: Every day | ORAL | Status: DC

## 2012-05-09 NOTE — Assessment & Plan Note (Signed)
We discussed healthy diet and exercise. She will try to obtain immunization records for our review. Declines flu shot, Obtain fasting labs, pap per GYN.

## 2012-05-09 NOTE — Assessment & Plan Note (Signed)
Stable on adderall- she will continue to monitor her weight and try to keep regular meals.

## 2012-05-09 NOTE — Patient Instructions (Addendum)
Please complete blood work prior to leaving. Follow up in 3 months- sooner if problems/concerns. Keep upcoming appointment with GYN.  Happy Thanksgiving!

## 2012-05-09 NOTE — Telephone Encounter (Signed)
Pt requested to pick up Cipro rx from [facility]. Rx called to [facility] and cancelled with [facility] at [facility].

## 2012-05-09 NOTE — Progress Notes (Signed)
Subjective:    Patient ID: Kristin Sandoval, female    DOB: 10-10-1985, 26 y.o.   MRN: [id number]  HPI  Pt here for fasting physical. Will try to obtain immunization record. Has current gyn. Has dysuria. Medication Refill Pt requests refill of Adderall. Straterra caused her to feel sleepy/groggy She is not exercising formally.  Not eating healthy.  Not eating "whole foods.  Lots of fast food.  Last pap 1/13.   ADD- strattera- made her very tired.  Had to leav work due to "nodding off." Went back on the adderral. She has been maintaining her weight. Feels like it is working well.       Review of Systems  Constitutional: Negative for unexpected weight change.  HENT: Negative for congestion.   Respiratory: Positive for cough.        Cough x 2 days.   Cardiovascular: Negative for leg swelling.  Gastrointestinal: Negative for nausea, vomiting, diarrhea and constipation.  Genitourinary: Positive for dysuria.       Periods are light and regular.  Musculoskeletal: Negative for myalgias and arthralgias.  Skin: Negative for rash.  Neurological: Negative for headaches.  Hematological: Negative for adenopathy.  Psychiatric/Behavioral:       Mood is "ok.         Past Medical History  Diagnosis Date  . Asthma     childhood    History   Social History  . Marital Status: Single    Spouse Name: N/A    Number of Children: 0  . Years of Education: N/A   Occupational History  .  [employer]   Social History Main Topics  . Smoking status: Former Smoker    Types: Cigarettes    Quit date: 06/30/2006  . Smokeless tobacco: Not on file  . Alcohol Use: Yes     Comment: 0-7 drinks weekly.  . Drug Use: No  . Sexually Active: Not on file   Other Topics Concern  . Not on file   Social History Narrative   Caffeine Use:  1 cup coffee dailyRegular exercise:  4 x weekly    Past Surgical History  Procedure Date  . Adenoidectomy     26 yrs old    Family History  Problem Relation Age  of Onset  . Alcohol abuse Father   . Arthritis Maternal Uncle   . Hyperlipidemia Maternal Uncle   . Alcohol abuse Paternal Aunt   . Arthritis Maternal Grandmother     DDD, spinal stenosis  . Hyperlipidemia Maternal Grandmother   . Alcohol abuse Maternal Grandfather   . Alcohol abuse Paternal Grandfather     Allergies  Allergen Reactions  . Acrylic Polymer (Carbomer)     Loses nail  . Adhesive (Tape) Rash  . Mango Flavor Rash    Current Outpatient Prescriptions on File Prior to Visit  Medication Sig Dispense Refill  . acyclovir ointment (ZOVIRAX) 5 % 1 application every 3 (three) hours. Every 3 hours as needed      . hydrocortisone-pramoxine (PROCTOFOAM-HC) rectal foam Place 1 applicator rectally 2 (two) times daily.  10 g  0  . ORTHO TRI-CYCLEN LO 0.18/0.215/0.25 MG-25 MCG tablet Take 1 tablet by mouth daily.      Marland Kitchen tretinoin (RETIN-A) 0.05 % cream Apply topically as needed.      . valACYclovir (VALTREX) 500 MG tablet Take 500 mg by mouth daily as needed.        BP 110/82  Pulse 121  Temp 99 F (37.2 C) (Oral)  Resp 16  Wt 122 lb 1.9 oz (55.393 kg)  SpO2 98%  LMP 05/02/2012    Objective:   Physical Exam  Physical Exam  Constitutional: She is oriented to person, place, and time. She appears well-developed and well-nourished. No distress.  HENT:  Head: Normocephalic and atraumatic.  Right Ear: Tympanic membrane and ear canal normal.  Left Ear: Tympanic membrane and ear canal normal.  Mouth/Throat: Oropharynx is clear and moist.  Eyes: Pupils are equal, round, and reactive to light. No scleral icterus.  Neck: Normal range of motion. No thyromegaly present.  Cardiovascular: Normal rate and regular rhythm.   No murmur heard. Pulmonary/Chest: Effort normal and breath sounds normal. No respiratory distress. He has no wheezes. She has no rales. She exhibits no tenderness.  Abdominal: Soft. Bowel sounds are normal. He exhibits no distension and no mass. There is no  tenderness. There is no rebound and no guarding.  Musculoskeletal: She exhibits no edema.  Lymphadenopathy:    She has no cervical adenopathy.  Neurological: She is alert and oriented to person, place, and time. She exhibits normal muscle tone. Coordination normal.  Skin: Skin is warm and dry.  Psychiatric: She has a normal mood and affect. Her behavior is normal. Judgment and thought content normal.  Breast/pelvic deferred to GYN          Assessment & Plan:         Assessment & Plan:

## 2012-05-10 LAB — BASIC METABOLIC PANEL WITH GFR
BUN: 13 mg/dL (ref 6–23)
CO2: 29 mEq/L (ref 19–32)
Calcium: 9.2 mg/dL (ref 8.4–10.5)
GFR, Est African American: >89 mL/min
Glucose, Bld: 84 mg/dL (ref 70–99)

## 2012-05-11 ENCOUNTER — Telehealth: Payer: Self-pay | Admitting: Family

## 2012-05-23 ENCOUNTER — Encounter: Payer: BC Managed Care – PPO | Admitting: Family

## 2012-06-20 HISTORY — PX: BREAST ENHANCEMENT SURGERY: SHX7

## 2012-08-04 ENCOUNTER — Other Ambulatory Visit: Payer: Self-pay

## 2012-08-09 ENCOUNTER — Telehealth: Payer: Self-pay | Admitting: Family

## 2012-08-09 NOTE — Telephone Encounter (Signed)
Patient left message on nurse voicemail requesting a new prescription for adderall.

## 2012-08-10 MED ORDER — AMPHETAMINE-DEXTROAMPHET ER 20 MG PO CP24: 20.0000 mg | ORAL_CAPSULE | Freq: Every day | ORAL | Status: DC

## 2012-08-10 NOTE — Telephone Encounter (Signed)
Rx last printed 05/19/12 #30. Rx printed and forwarded to Provider for signature. Pt is due for follow up this month. Left message for pt to return my call.

## 2012-08-13 NOTE — Telephone Encounter (Signed)
Rx signed.

## 2012-08-13 NOTE — Telephone Encounter (Signed)
Notified pt. 

## 2012-10-08 ENCOUNTER — Encounter: Payer: Self-pay | Admitting: *Deleted

## 2012-10-08 ENCOUNTER — Telehealth: Payer: Self-pay | Admitting: *Deleted

## 2012-10-08 MED ORDER — AMPHETAMINE-DEXTROAMPHET ER 15 MG PO CP24: 15.0000 mg | ORAL_CAPSULE | ORAL | Status: DC

## 2012-10-08 MED ORDER — HYDROCORTISONE ACE-PRAMOXINE 1-1 % RE FOAM: 1.0000 | Freq: Two times a day (BID) | RECTAL | Status: DC

## 2012-10-08 NOTE — Telephone Encounter (Signed)
Received call from pt wanting to know if she could be placed on a lower dose of adderall XR. Has been having some shortness of breath, insomnia and decreased appetite. States she did not mention the shortness of breath previously because she was going to go off of medication temporarily for breast augmentation.  Pt had augmentation and has restarted medication and notes return of symptoms. Pt also requests refill of proctofoam as she states she had to take pain medication after her augmentation and is having similar problems as before with bowel movements. Advised pt she is due for follow up and she voices understanding but would like this addressed now.  Please advise.

## 2012-10-08 NOTE — Telephone Encounter (Signed)
Notified pt and she verified that shortness of breath was occurring prior to her surgery. Pt scheduled f/u for 10/16/12 at 1:15pm and rx placed at front desk for pick up.

## 2012-10-08 NOTE — Telephone Encounter (Signed)
Adderall decreased to 15 mg.  Proctofoam sent. If shortness of breath started after her surgical procedure, needs to be seen ASAP as we would need to rule out blood clot in her lungs.  Otherwise follow up in office in the next 1 week.

## 2012-10-16 ENCOUNTER — Ambulatory Visit (INDEPENDENT_AMBULATORY_CARE_PROVIDER_SITE_OTHER): Payer: BC Managed Care – PPO | Admitting: Family

## 2012-10-16 ENCOUNTER — Encounter: Payer: Self-pay | Admitting: Family

## 2012-10-16 VITALS — BP 110/78 | HR 100 | Temp 97.8°F | Resp 16 | Ht 62.5 in | Wt 143.1 lb

## 2012-10-16 DIAGNOSIS — F909 Attention-deficit hyperactivity disorder, unspecified type: Secondary | ICD-10-CM

## 2012-10-16 DIAGNOSIS — F329 Major depressive disorder, single episode, unspecified: Secondary | ICD-10-CM

## 2012-10-16 DIAGNOSIS — R635 Abnormal weight gain: Secondary | ICD-10-CM | POA: Insufficient documentation

## 2012-10-16 DIAGNOSIS — F32A Depression, unspecified: Secondary | ICD-10-CM

## 2012-10-16 NOTE — Assessment & Plan Note (Signed)
Will monitor now that she is back on adderall.

## 2012-10-16 NOTE — Patient Instructions (Addendum)
Please follow up in 6 months for a fasting physical.

## 2012-10-16 NOTE — Assessment & Plan Note (Signed)
Stable off of meds.  Monitor.  

## 2012-10-16 NOTE — Progress Notes (Signed)
Subjective:    Patient ID: Kristin Sandoval, female    DOB: March 04, 1986, 27 y.o.   MRN: [id number]  HPI  Kristin Sandoval is a 27 yr old female who presents today for follow up.  1) Depression- reports that boyfriends day committed suicide. This period of time was difficult.  However, overall she reports that her depression has been well controlled.  2) ADHD- she reports that she had some shortness of breath on the 20 mg dose of adderall.  She reports that since her dose was dropped to 15mg  she has felt much better. Has noticed good concentration on this dose.  3) Weight gain- she attributes this to her recent breast augmentation and being off of her adderall for a period of time.    Review of Systems See HPI  Past Medical History  Diagnosis Date  . Asthma     childhood    History   Social History  . Marital Status: Single    Spouse Name: N/A    Number of Children: 0  . Years of Education: N/A   Occupational History  .  [employer]   Social History Main Topics  . Smoking status: Former Smoker    Types: Cigarettes    Quit date: 06/30/2006  . Smokeless tobacco: Not on file  . Alcohol Use: Yes     Comment: 0-7 drinks weekly.  . Drug Use: No  . Sexually Active: Not on file   Other Topics Concern  . Not on file   Social History Narrative   Caffeine Use:  1 cup coffee daily   Regular exercise:  4 x weekly          Past Surgical History  Procedure Laterality Date  . Adenoidectomy      27 yrs old  . Breast enhancement surgery Bilateral 2014    Family History  Problem Relation Age of Onset  . Alcohol abuse Father   . Arthritis Maternal Uncle   . Hyperlipidemia Maternal Uncle   . Alcohol abuse Paternal Aunt   . Arthritis Maternal Grandmother     DDD, spinal stenosis  . Hyperlipidemia Maternal Grandmother   . Alcohol abuse Maternal Grandfather   . Alcohol abuse Paternal Grandfather     Allergies  Allergen Reactions  . Acrylic Polymer (Carbomer)     Loses nail   . Adhesive (Tape) Rash  . Mango Flavor Rash    Current Outpatient Prescriptions on File Prior to Visit  Medication Sig Dispense Refill  . acyclovir ointment (ZOVIRAX) 5 % 1 application every 3 (three) hours. Every 3 hours as needed      . amphetamine-dextroamphetamine (ADDERALL XR) 15 MG 24 hr capsule Take 1 capsule (15 mg total) by mouth every morning.  30 capsule  0  . hydrocortisone-pramoxine (PROCTOFOAM-HC) rectal foam Place 1 applicator rectally 2 (two) times daily.  10 g  0  . tretinoin (RETIN-A) 0.05 % cream Apply topically as needed.      . valACYclovir (VALTREX) 500 MG tablet Take 500 mg by mouth daily as needed.       No current facility-administered medications on file prior to visit.    BP 110/78  Pulse 100  Temp(Src) 97.8 F (36.6 C) (Oral)  Resp 16  Ht 5' 2.5" (1.588 m)  Wt 143 lb 1.3 oz (64.901 kg)  BMI 25.74 kg/m2  SpO2 99%       Objective:   Physical Exam  Constitutional: She is oriented to person, place, and time. She  appears well-developed and well-nourished. No distress.  Neurological: She is alert and oriented to person, place, and time.  Psychiatric: She has a normal mood and affect. Her behavior is normal. Judgment and thought content normal.          Assessment & Plan:

## 2012-10-16 NOTE — Assessment & Plan Note (Signed)
Stable on decreased dose of adderall.

## 2012-11-29 ENCOUNTER — Telehealth: Payer: Self-pay | Admitting: *Deleted

## 2012-11-29 MED ORDER — AMPHETAMINE-DEXTROAMPHET ER 15 MG PO CP24: 15.0000 mg | ORAL_CAPSULE | ORAL | Status: DC

## 2012-11-29 NOTE — Telephone Encounter (Signed)
Pt left message requesting refill of Adderall XR. Last rx sent 10/11/12. Rx printed, can you sign in Provider's absence?

## 2012-11-29 NOTE — Telephone Encounter (Signed)
Rx placed at front desk for pick up and pt notified. 

## 2012-12-05 ENCOUNTER — Telehealth: Payer: Self-pay | Admitting: *Deleted

## 2012-12-05 NOTE — Telephone Encounter (Signed)
Received fax pt needing PA on her Adderral 15 mg. Form completed and fax back waiting on approval status...Raechel Chute

## 2012-12-06 NOTE — Telephone Encounter (Signed)
Received Pa back med has been approve. Notified pharmacy spoke with Sharyl Nimrod gave approval status...Raechel Chute

## 2013-01-16 ENCOUNTER — Telehealth: Payer: Self-pay | Admitting: *Deleted

## 2013-01-16 ENCOUNTER — Ambulatory Visit
Admission: RE | Admit: 2013-01-16 | Discharge: 2013-01-16 | Disposition: A | Discharge status: Home / Self Care | Payer: BC Managed Care – PPO | Source: Ambulatory Visit | Attending: Family Medicine | Admitting: Family Medicine

## 2013-01-16 ENCOUNTER — Other Ambulatory Visit (HOSPITAL_COMMUNITY): Payer: Self-pay | Admitting: Family Medicine

## 2013-01-16 DIAGNOSIS — S99911A Unspecified injury of right ankle, initial encounter: Secondary | ICD-10-CM

## 2013-01-16 DIAGNOSIS — S99919A Unspecified injury of unspecified ankle, initial encounter: Secondary | ICD-10-CM

## 2013-01-16 NOTE — Telephone Encounter (Signed)
Patient reports injury to Right ankle [sprain], Xray done at [facility] today; requesting referral to Ortho and/or appropriate facility/SLS Please advise.

## 2013-01-16 NOTE — Telephone Encounter (Signed)
Placed referral  

## 2013-01-17 NOTE — Telephone Encounter (Signed)
LMOM with contact name and number [for return call if needed] RE: referral placed to inform pt/SLS

## 2013-01-22 ENCOUNTER — Telehealth: Payer: Self-pay | Admitting: *Deleted

## 2013-01-22 MED ORDER — AMPHETAMINE-DEXTROAMPHET ER 15 MG PO CP24: 15.0000 mg | ORAL_CAPSULE | ORAL | Status: DC

## 2013-01-22 NOTE — Telephone Encounter (Signed)
Received message from pt requesting refill of adderall xr. Last rx printed 11/29/12. Rx printed and forwarded to Provider for signature.

## 2013-01-23 NOTE — Telephone Encounter (Signed)
Left message on cell# that rx is ready for pick up at the front desk.  

## 2013-01-23 NOTE — Telephone Encounter (Signed)
Rx signed and placed at front desk for pick up. Left message on work # to return my call.

## 2013-03-01 ENCOUNTER — Telehealth: Payer: Self-pay | Admitting: *Deleted

## 2013-03-01 MED ORDER — AMPHETAMINE-DEXTROAMPHET ER 15 MG PO CP24: 15.0000 mg | ORAL_CAPSULE | ORAL | Status: DC

## 2013-03-01 NOTE — Telephone Encounter (Signed)
Received message from pt requesting rx of adderall xr. Rx last printed 01/22/13, #30. Pt due for follow up in October. Rx printed and forwarded to Provider for signature.

## 2013-03-01 NOTE — Telephone Encounter (Signed)
Detailed message left on wk voicemail per pt request and rx placed at front desk for pick up.

## 2013-03-01 NOTE — Telephone Encounter (Signed)
Rx signed.

## 2013-04-29 ENCOUNTER — Telehealth: Payer: Self-pay | Admitting: Family

## 2013-04-29 NOTE — Telephone Encounter (Signed)
Patient is requesting a new prescription of adderall °

## 2013-04-30 MED ORDER — AMPHETAMINE-DEXTROAMPHET ER 15 MG PO CP24: 15.0000 mg | ORAL_CAPSULE | ORAL | Status: DC

## 2013-04-30 NOTE — Telephone Encounter (Signed)
Notified pt and placed rx at front desk for pick up. 

## 2013-04-30 NOTE — Telephone Encounter (Signed)
Left message for patient to return my call.

## 2013-04-30 NOTE — Telephone Encounter (Signed)
Rx signed.

## 2013-04-30 NOTE — Telephone Encounter (Signed)
Pt last seen in April and advised fasting physical in 6 months.  Adderall rx last printed 03/01/13. Rx printed and forwarded to Provider for signature.  Please call pt to schedule fasting physical.

## 2013-05-02 ENCOUNTER — Telehealth: Payer: Self-pay | Admitting: Family

## 2013-05-02 NOTE — Telephone Encounter (Signed)
She would like information on a natural or over the counter sleep aid

## 2013-05-03 NOTE — Telephone Encounter (Signed)
Notified pt and she voices understanding. 

## 2013-05-03 NOTE — Telephone Encounter (Signed)
Unable to leave message on cell# as mailbox is full. Left message on work # to return my call.

## 2013-05-03 NOTE — Telephone Encounter (Signed)
Ok to start melatonin.

## 2013-05-03 NOTE — Telephone Encounter (Signed)
Spoke with pt. She states she tried Theatre manager and it helped her sleep at night but she did not feel it worked as well for her focus. She has restarted Adderall and states that it causes her to have insomnia.  She states she picked up OTC melatonin and wants to know if you think this will help or will it interfere with anything she is already taking. Pt is scheduling a physical for sometime after 05/09/13 and is aware there will be a separate charge to discuss other concerns at that appt. Please advise.

## 2013-05-10 ENCOUNTER — Ambulatory Visit (INDEPENDENT_AMBULATORY_CARE_PROVIDER_SITE_OTHER): Payer: BC Managed Care – PPO | Admitting: Family

## 2013-05-10 ENCOUNTER — Encounter: Payer: Self-pay | Admitting: Family

## 2013-05-10 VITALS — BP 106/80 | HR 100 | Temp 97.9°F | Resp 16 | Ht 62.5 in | Wt 142.1 lb

## 2013-05-10 DIAGNOSIS — R635 Abnormal weight gain: Secondary | ICD-10-CM

## 2013-05-10 DIAGNOSIS — R0602 Shortness of breath: Secondary | ICD-10-CM

## 2013-05-10 DIAGNOSIS — Z91018 Allergy to other foods: Secondary | ICD-10-CM | POA: Insufficient documentation

## 2013-05-10 DIAGNOSIS — Z0000 Encounter for general adult medical examination without abnormal findings: Secondary | ICD-10-CM

## 2013-05-10 DIAGNOSIS — T781XXA Other adverse food reactions, not elsewhere classified, initial encounter: Secondary | ICD-10-CM

## 2013-05-10 DIAGNOSIS — Z0100 Encounter for examination of eyes and vision without abnormal findings: Secondary | ICD-10-CM

## 2013-05-10 DIAGNOSIS — F909 Attention-deficit hyperactivity disorder, unspecified type: Secondary | ICD-10-CM

## 2013-05-10 LAB — BASIC METABOLIC PANEL WITH GFR
BUN: 10 mg/dL (ref 6–23)
Creat: 0.8 mg/dL (ref 0.50–1.10)
GFR, Est African American: >89 mL/min
GFR, Est Non African American: >89 mL/min

## 2013-05-10 LAB — CBC WITH DIFFERENTIAL/PLATELET
Basophils Absolute: 0 10*3/uL (ref 0.0–0.1)
Basophils Relative: 1 % (ref 0–1)
Eosinophils Relative: 2 % (ref 0–5)
HCT: 39.4 % (ref 36.0–46.0)
Lymphocytes Relative: 39 % (ref 12–46)
MCHC: 34 g/dL (ref 30.0–36.0)
MCV: 85.7 fL (ref 78.0–100.0)
Monocytes Absolute: 0.6 10*3/uL (ref 0.1–1.0)
RDW: 14.1 % (ref 11.5–15.5)

## 2013-05-10 LAB — LIPID PANEL
Cholesterol: 152 mg/dL (ref 0–200)
Total CHOL/HDL Ratio: 2.6 Ratio
Triglycerides: 47 mg/dL (ref <150)
VLDL: 9 mg/dL (ref 0–40)

## 2013-05-10 LAB — HEPATIC FUNCTION PANEL
ALT: 19 U/L (ref 0–35)
Indirect Bilirubin: 0.5 mg/dL (ref 0.0–0.9)
Total Protein: 7.4 g/dL (ref 6.0–8.3)

## 2013-05-10 MED ORDER — EPINEPHRINE 0.3 MG/0.3ML IJ SOAJ: 0.3000 mg | Freq: Once | INTRAMUSCULAR | Status: DC

## 2013-05-10 NOTE — Patient Instructions (Addendum)
Please complete lab work prior to leaving. Call if your insomnia does not improve with melatonin. Avoid apples. You will be contacted about your referral to allergist.  Follow up in 6 months, sooner if problems/concerns.

## 2013-05-10 NOTE — Assessment & Plan Note (Signed)
Well controlled, but having trouble sleeping.  She will try melatonin and cut back caffeine intake.  If not improvement, we discussed regular release adderall in the AM only.

## 2013-05-10 NOTE — Assessment & Plan Note (Signed)
Stable. Down a pound since last visit.

## 2013-05-10 NOTE — Assessment & Plan Note (Signed)
Discussed importance of keeping epipen and liquid benadryl on hand.  Rx and coupon for epipen provided today.  Advised pt to continue to avoid trigger foods and will refer to allergist for further evaluation and testing.

## 2013-05-10 NOTE — Assessment & Plan Note (Signed)
Continue healthy diet, exercise, declines tetanus.

## 2013-05-10 NOTE — Progress Notes (Signed)
Subjective:    Patient ID: Kristin Sandoval, female    DOB: 1985-08-26, 27 y.o.   MRN: [id number]  HPI  Kristin Sandoval is 27 yr old female who presents today for cpx.  Immunizations: declines flu shot.  Last tetanus >10 yrs.  Declines tetanus.  Diet: reports healthier diet  Exercise: doing some zumba, boxing, videos, treadmill Pap Smear: up to date- Kristin Sandoval GYN- Nestor Ramp  ADHD- reports that she topped for a while. Tried the straterra at night.  Found that it wasn't working as well during the day.  Feels like "mind is lazy." Got adderall filled again and added melatonin- hasn't taken every night yet.    Reaction to apple- reports that she developed shortness of breath after eating "mountain apples."  Also had rash- "like with mangos."     Review of Systems  HENT: Negative for postnasal drip.   Respiratory: Negative for cough.   Gastrointestinal: Negative for nausea, vomiting and diarrhea.  Neurological:       Occasional HA's.  Occur premenstrual  Psychiatric/Behavioral:       See HPI   Past Medical History  Diagnosis Date  . Asthma     childhood    History   Social History  . Marital Status: Single    Spouse Name: N/A    Number of Children: 0  . Years of Education: N/A   Occupational History  .  [employer]   Social History Main Topics  . Smoking status: Former Smoker    Types: Cigarettes    Quit date: 06/30/2006  . Smokeless tobacco: Not on file  . Alcohol Use: Yes     Comment: 0-7 drinks weekly.  . Drug Use: No  . Sexual Activity: Not on file   Other Topics Concern  . Not on file   Social History Narrative   Caffeine Use:  1 cup coffee daily   Regular exercise:  4 x weekly          Past Surgical History  Procedure Laterality Date  . Adenoidectomy      27 yrs old  . Breast enhancement surgery Bilateral 2014    Family History  Problem Relation Age of Onset  . Alcohol abuse Father   . Arthritis Maternal Uncle   . Hyperlipidemia Maternal Uncle    . Alcohol abuse Paternal Aunt   . Arthritis Maternal Grandmother     DDD, spinal stenosis  . Hyperlipidemia Maternal Grandmother   . Alcohol abuse Maternal Grandfather   . Alcohol abuse Paternal Grandfather     Allergies  Allergen Reactions  . Apple Shortness Of Breath  . Acrylic Polymer [Carbomer]     Loses nail  . Adhesive [Tape] Rash  . Mango Flavor Rash    Current Outpatient Prescriptions on File Prior to Visit  Medication Sig Dispense Refill  . amphetamine-dextroamphetamine (ADDERALL XR) 15 MG 24 hr capsule Take 1 capsule (15 mg total) by mouth every morning.  30 capsule  0  . etonogestrel (IMPLANON) 68 MG IMPL implant Inject 1 each into the skin once.      . tretinoin (RETIN-A) 0.05 % cream Apply topically as needed.      . valACYclovir (VALTREX) 500 MG tablet Take 500 mg by mouth daily as needed.       No current facility-administered medications on file prior to visit.    BP 106/80  Pulse 100  Temp(Src) 97.9 F (36.6 C) (Oral)  Resp 16  Ht 5' 2.5" (1.588 m)  Wt 142 lb 1.3 oz (64.447 kg)  BMI 25.56 kg/m2  SpO2 99%       Objective:   Physical Exam  Physical Exam  Constitutional: She is oriented to person, place, and time. She appears well-developed and well-nourished. No distress.  HENT:  Head: Normocephalic and atraumatic.  Right Ear: Tympanic membrane and ear canal normal.  Left Ear: Tympanic membrane and ear canal normal.  Mouth/Throat: Oropharynx is clear and moist.  Eyes: Pupils are equal, round, and reactive to light. No scleral icterus.  Neck: Normal range of motion. No thyromegaly present.  Cardiovascular: Normal rate and regular rhythm.   No murmur heard. Pulmonary/Chest: Effort normal and breath sounds normal. No respiratory distress. He has no wheezes. She has no rales. She exhibits no tenderness.  Abdominal: Soft. Bowel sounds are normal. He exhibits no distension and no mass. There is no tenderness. There is no rebound and no guarding.   Musculoskeletal: She exhibits no edema.  Lymphadenopathy:    She has no cervical adenopathy.  Neurological: She is alert and oriented to person, place, and time. She exhibits normal muscle tone. Coordination normal.  Skin: Skin is warm and dry.  Psychiatric: She has a normal mood and affect. Her behavior is normal. Judgment and thought content normal.  Breast/pelvic: deferred to GYN.          Assessment & Plan:         Assessment & Plan:

## 2013-05-11 LAB — URINALYSIS, ROUTINE W REFLEX MICROSCOPIC
Bilirubin Urine: NEGATIVE
Glucose, UA: NEGATIVE mg/dL
Hgb urine dipstick: NEGATIVE
Ketones, ur: NEGATIVE mg/dL
Protein, ur: NEGATIVE mg/dL

## 2013-05-13 ENCOUNTER — Telehealth: Payer: Self-pay | Admitting: Family

## 2013-05-13 NOTE — Telephone Encounter (Signed)
Unable to leave message on cell# as mailbox was full.  Left message on work voicemail that paperwork is complete and ready for pick up and to call if any questions. Placed copy of lab results with paperwork for pick up.

## 2013-05-13 NOTE — Telephone Encounter (Signed)
All lab work normal. Form ready for pick up.

## 2013-06-26 ENCOUNTER — Telehealth: Payer: Self-pay | Admitting: Family

## 2013-06-26 MED ORDER — AMPHETAMINE-DEXTROAMPHET ER 15 MG PO CP24: 15.0000 mg | ORAL_CAPSULE | ORAL | Status: DC

## 2013-06-26 NOTE — Telephone Encounter (Signed)
adderal xr 15 mg  Needs to pick up refill

## 2013-06-26 NOTE — Telephone Encounter (Signed)
Last rx provided 04/30/13.  Rx printed and forwarded to Provider for signature.

## 2013-06-26 NOTE — Telephone Encounter (Signed)
Left message on cell# that rx is ready for pick up. Rx placed at front desk.

## 2013-08-19 ENCOUNTER — Telehealth: Payer: Self-pay | Admitting: *Deleted

## 2013-08-19 MED ORDER — AMPHETAMINE-DEXTROAMPHET ER 15 MG PO CP24: 15.0000 mg | ORAL_CAPSULE | ORAL | Status: DC

## 2013-08-19 NOTE — Telephone Encounter (Signed)
Received message from pt requesting refill of adderall XR. Last rx printed 06/26/13. Pt due for follow up in May. Rx printed and forwarded to Provider for signature.

## 2013-08-19 NOTE — Telephone Encounter (Signed)
Rx sent to front desk for pick up and pt has been notified.

## 2013-09-18 HISTORY — PX: TONSILLECTOMY: SUR1361

## 2013-09-24 ENCOUNTER — Telehealth: Payer: Self-pay | Admitting: *Deleted

## 2013-09-24 MED ORDER — AMPHETAMINE-DEXTROAMPHET ER 15 MG PO CP24: 15.0000 mg | ORAL_CAPSULE | ORAL | Status: DC

## 2013-09-24 NOTE — Telephone Encounter (Signed)
Received message from pt requesting refill of adderall. Last rx printed 08/19/13. Pt due for follow up in May.  Rx printed and forwarded to Provider for signature.

## 2013-09-24 NOTE — Telephone Encounter (Signed)
Left a msg to return call  

## 2013-09-25 NOTE — Telephone Encounter (Signed)
Notified pt rx at front desk for pick up.

## 2013-09-26 ENCOUNTER — Ambulatory Visit (INDEPENDENT_AMBULATORY_CARE_PROVIDER_SITE_OTHER): Payer: BC Managed Care – PPO | Admitting: Family Medicine

## 2013-09-26 ENCOUNTER — Encounter: Payer: Self-pay | Admitting: Family Medicine

## 2013-09-26 VITALS — BP 112/76 | HR 87 | Temp 98.3°F | Resp 18 | Ht 62.5 in | Wt 149.0 lb

## 2013-09-26 DIAGNOSIS — G44229 Chronic tension-type headache, not intractable: Secondary | ICD-10-CM

## 2013-09-26 MED ORDER — IBUPROFEN 600 MG PO TABS: ORAL_TABLET | ORAL | Status: DC

## 2013-09-26 NOTE — Patient Instructions (Signed)
Be aware of caffeine intake. Try to eat more during daytime. Find something for stress relief at least 3 times per week.

## 2013-09-26 NOTE — Progress Notes (Signed)
OFFICE NOTE  09/26/2013  CC:  Chief Complaint  Patient presents with  . Headache    behind eyes, on top of head. Started a while ago     HPI: Patient is a 28 y.o. Caucasian female who is here for headaches. Onset about 7-8 mo ago, usually located behind eyes and on top of head and behind ears, dull constant tension, without nausea or photo or phonophobia.  Started out about 1 time per month and around the time of her period.  The last month they have been more frequent. Had several in the last week.  Lasts all day, won't take any medication. Recently took ibup and on another occasion alleve (2 pills) and had no relief but she had waited all day before taking the med.  She essentially finally sleeps and then wakes up w/out a HA.  No visual sx's.   She usually goes all day w/out eating. Avg hours of sleep per night is 7-8.  Caffeine intake has been steady.   Pertinent PMH:  Past Medical History  Diagnosis Date  . Asthma     childhood   Past Surgical History  Procedure Laterality Date  . Adenoidectomy      28 yrs old  . Breast enhancement surgery Bilateral 2014    MEDS:  Outpatient Prescriptions Prior to Visit  Medication Sig Dispense Refill  . amphetamine-dextroamphetamine (ADDERALL XR) 15 MG 24 hr capsule Take 1 capsule (15 mg total) by mouth every morning.  30 capsule  0  . EPINEPHrine (EPIPEN) 0.3 mg/0.3 mL SOAJ injection Inject 0.3 mLs (0.3 mg total) into the muscle once.  2 Device  0  . etonogestrel (IMPLANON) 68 MG IMPL implant Inject 1 each into the skin once.      . tretinoin (RETIN-A) 0.05 % cream Apply topically as needed.      . valACYclovir (VALTREX) 500 MG tablet Take 500 mg by mouth daily as needed.      . hydrocortisone-pramoxine (PROCTOFOAM-HC) rectal foam Place 1 applicator rectally 2 (two) times daily as needed.       No facility-administered medications prior to visit.    PE: Blood pressure 112/76, pulse 87, temperature 98.3 F (36.8 C), temperature  source Oral, resp. rate 18, height 5' 2.5" (1.588 m), weight 149 lb (67.586 kg), SpO2 100.00%. Pt examined with CMA Quentin Ore as chaperone. Gen: Alert, well appearing.  Patient is oriented to person, place, time, and situation. AFFECT: pleasant, lucid thought and speech. ENT: Ears: EACs clear, normal epithelium.  TMs with good light reflex and landmarks bilaterally.  Eyes: no injection, icteris, swelling, or exudate.  EOMI, PERRLA. Nose: no drainage or turbinate edema/swelling.  No injection or focal lesion.  Mouth: lips without lesion/swelling.  Oral mucosa pink and moist.  Dentition intact and without obvious caries or gingival swelling.  Oropharynx without erythema, exudate, or swelling.  CV: RRR, no m/r/g.   LUNGS: CTA bilat, nonlabored resps, good aeration in all lung fields. EXT: no clubbing, cyanosis, or edema.  Neuro: CN 2-12 intact bilaterally, strength 5/5 in proximal and distal upper extremities and lower extremities bilaterally.  No sensory deficits.  No tremor.  No disdiadochokinesis.  No ataxia.  Upper extremity and lower extremity DTRs symmetric.  No pronator drift.  LABS: none today  IMPRESSION AND PLAN:  Tension headaches. Stressful job, poor eating habits contributing. Needs to eat regularly during day, cut back slowly on caffeine intake, work on hobbies/stress relieving mechanisms.   Take 600 mg ibuprofen at ONSET  of HA or as early in HA as possible: rx given today. Signs/symptoms to call or return for were reviewed and pt expressed understanding.  An After Visit Summary was printed and given to the patient.  Spent 25 min with pt today, with >50% of this time spent in counseling and care coordination regarding the above problems.  FOLLOW UP: prn

## 2013-10-04 ENCOUNTER — Other Ambulatory Visit: Payer: Self-pay | Admitting: Otolaryngology

## 2013-11-15 ENCOUNTER — Telehealth: Payer: Self-pay | Admitting: Family

## 2013-11-15 MED ORDER — AMPHETAMINE-DEXTROAMPHET ER 15 MG PO CP24: 15.0000 mg | ORAL_CAPSULE | ORAL | Status: DC

## 2013-11-15 NOTE — Telephone Encounter (Signed)
Left message on pt's voicemail to return my call. Rx placed at front desk with note to schedule follow up soon.

## 2013-11-15 NOTE — Telephone Encounter (Signed)
Needs rx for adderal 15mg  xr

## 2013-11-15 NOTE — Telephone Encounter (Signed)
Rx last printed 09/24/13. Rx printed and forwarded to Provider for signature. Pt is due for 6 month f/u.

## 2013-12-16 ENCOUNTER — Ambulatory Visit (INDEPENDENT_AMBULATORY_CARE_PROVIDER_SITE_OTHER): Payer: BC Managed Care – PPO | Admitting: Family

## 2013-12-16 ENCOUNTER — Encounter: Payer: Self-pay | Admitting: Family

## 2013-12-16 VITALS — BP 100/76 | HR 91 | Temp 98.7°F | Resp 16 | Ht 62.5 in | Wt 142.0 lb

## 2013-12-16 DIAGNOSIS — Z0000 Encounter for general adult medical examination without abnormal findings: Secondary | ICD-10-CM

## 2013-12-16 DIAGNOSIS — F908 Attention-deficit hyperactivity disorder, other type: Secondary | ICD-10-CM

## 2013-12-16 DIAGNOSIS — K649 Unspecified hemorrhoids: Secondary | ICD-10-CM

## 2013-12-16 DIAGNOSIS — F909 Attention-deficit hyperactivity disorder, unspecified type: Secondary | ICD-10-CM

## 2013-12-16 MED ORDER — AMPHETAMINE-DEXTROAMPHET ER 15 MG PO CP24: 15.0000 mg | ORAL_CAPSULE | ORAL | Status: DC

## 2013-12-16 MED ORDER — VALACYCLOVIR HCL 500 MG PO TABS: 500.0000 mg | ORAL_TABLET | Freq: Every day | ORAL | Status: DC | PRN

## 2013-12-16 NOTE — Patient Instructions (Signed)
Please follow up in 6 months.

## 2013-12-16 NOTE — Assessment & Plan Note (Signed)
Still has them but is able to deal with them by staying hydrated and eating fiber in diet.

## 2013-12-16 NOTE — Progress Notes (Signed)
Subjective:    Patient ID: Kristin Sandoval, female    DOB: [birth year], 28 y.o.   MRN: [id number]  HPI Kristin Sandoval is a 28 yo female here for fu for ADHD.  ADHD-  Stable on current dose. Able to focus well at work. Taking melatonin at night to help her sleep. This seems to be working.  Hemorrhoids -  Pt reports that hemorrhoids persist but she is able to control them with good hydration and high fiber diet.     Review of Systems  Constitutional: Negative for fever, chills, diaphoresis and fatigue.  Gastrointestinal: Negative for diarrhea, constipation and anal bleeding.  Psychiatric/Behavioral: Negative for decreased concentration.   Past Medical History  Diagnosis Date  . Asthma     childhood    History   Social History  . Marital Status: Single    Spouse Name: N/A    Number of Children: 0  . Years of Education: N/A   Occupational History  .  [employer]   Social History Main Topics  . Smoking status: Former Smoker    Types: Cigarettes    Quit date: 06/30/2006  . Smokeless tobacco: Not on file  . Alcohol Use: Yes     Comment: 0-7 drinks weekly.  . Drug Use: No  . Sexual Activity: Not on file   Other Topics Concern  . Not on file   Social History Narrative   Caffeine Use:  1 cup coffee daily   Regular exercise:  4 x weekly          Past Surgical History  Procedure Laterality Date  . Adenoidectomy      28 yrs old  . Breast enhancement surgery Bilateral 2014  . Tonsillectomy  4/15    due to tonsil stones    Family History  Problem Relation Age of Onset  . Alcohol abuse Father   . Arthritis Maternal Uncle   . Hyperlipidemia Maternal Uncle   . Alcohol abuse Paternal Aunt   . Arthritis Maternal Grandmother     DDD, spinal stenosis  . Hyperlipidemia Maternal Grandmother   . Alcohol abuse Maternal Grandfather   . Alcohol abuse Paternal Grandfather     Allergies  Allergen Reactions  . Apple Shortness Of Breath  . Acrylic Polymer [Carbomer]     Loses  nail  . Adhesive [Tape] Rash  . Mango Flavor Rash    Current Outpatient Prescriptions on File Prior to Visit  Medication Sig Dispense Refill  . EPINEPHrine (EPIPEN) 0.3 mg/0.3 mL SOAJ injection Inject 0.3 mLs (0.3 mg total) into the muscle once.  2 Device  0  . etonogestrel (IMPLANON) 68 MG IMPL implant Inject 1 each into the skin once.      . hydrocortisone-pramoxine (PROCTOFOAM-HC) rectal foam Place 1 applicator rectally 2 (two) times daily as needed.      Marland Kitchen ibuprofen (ADVIL,MOTRIN) 600 MG tablet 1 tab po q8h prn at onset of headache  30 tablet  3  . tretinoin (RETIN-A) 0.05 % cream Apply topically as needed.       No current facility-administered medications on file prior to visit.    BP 100/76  Pulse 91  Temp(Src) 98.7 F (37.1 C) (Oral)  Resp 16  Ht 5' 2.5" (1.588 m)  Wt 142 lb 0.6 oz (64.429 kg)  BMI 25.55 kg/m2  SpO2 99%       Objective:   Physical Exam  Constitutional: She is oriented to person, place, and time. She appears well-developed and well-nourished.  HENT:  Head: Normocephalic and atraumatic.  Mouth/Throat: No oropharyngeal exudate.  Eyes: Pupils are equal, round, and reactive to light. No scleral icterus.  Cardiovascular: Normal rate, regular rhythm and normal heart sounds.  Exam reveals no gallop and no friction rub.   No murmur heard. Pulmonary/Chest: Effort normal and breath sounds normal.  Lymphadenopathy:    She has no cervical adenopathy.  Neurological: She is alert and oriented to person, place, and time.  Skin: Skin is warm and dry.  Psychiatric: She has a normal mood and affect. Her behavior is normal. Judgment and thought content normal.          Assessment & Plan:  Patient seen by [facility] NP-student.  I have personally seen and examined patient and agree with Ms. Whitmire's assessment and plan.

## 2013-12-16 NOTE — Assessment & Plan Note (Signed)
Stable on current dose of adderall. Continue same.  

## 2013-12-16 NOTE — Progress Notes (Signed)
Pre visit review using our clinic review tool, if applicable. No additional management support is needed unless otherwise documented below in the visit note. 

## 2013-12-17 ENCOUNTER — Telehealth: Payer: Self-pay | Admitting: Family

## 2013-12-17 NOTE — Telephone Encounter (Signed)
Please notify pt that Dr. Charlett Blake has agreed to sign rx's for her for 3 months at a time. Next time her adderall is due, please forward to Dr. Charlett Blake to sign.

## 2013-12-17 NOTE — Telephone Encounter (Signed)
Attempted to leave message on cell# but mailbox was full. Left message on work # to return my call.

## 2013-12-18 NOTE — Telephone Encounter (Signed)
Notified pt and she voices understanding. 

## 2014-02-10 ENCOUNTER — Telehealth: Payer: Self-pay | Admitting: *Deleted

## 2014-02-10 MED ORDER — AMPHETAMINE-DEXTROAMPHET ER 15 MG PO CP24: 15.0000 mg | ORAL_CAPSULE | ORAL | Status: DC

## 2014-02-10 NOTE — Telephone Encounter (Signed)
Left message on work # re: completion and Rxs sent to front desk for pick up.

## 2014-02-10 NOTE — Telephone Encounter (Signed)
Pt left message requesting 90 day supply of adderall XR. Last rx printed 12/16/13, #30. Rxs printed and forwarded to Provider for signature.

## 2014-06-03 ENCOUNTER — Telehealth: Payer: Self-pay | Admitting: Family

## 2014-06-03 MED ORDER — AMPHETAMINE-DEXTROAMPHET ER 15 MG PO CP24: 15.0000 mg | ORAL_CAPSULE | ORAL | Status: DC

## 2014-06-03 NOTE — Telephone Encounter (Signed)
rx refill - adderall xr 15mg   Last OV- 12/16/13 Last refilled- 02/10/14 # 30 / 8PF  UDS- contract.   Pt requesting to up dosage to 20 mg due to tolerance level not being as effective, also if it could be refilled for 3 months instead of monthly. Please advise.

## 2014-06-03 NOTE — Telephone Encounter (Signed)
She is due for 6 month follow up.  We can discuss dose change at her follow up.  See refill for one month for now until she is seen back in office.

## 2014-06-03 NOTE — Telephone Encounter (Signed)
Pt requesting a refill amphetamine-dextroamphetamine (ADDERALL XR) 15 MG 24 hr capsule and would like to discuss medication. Please advise please call work # [phone]

## 2014-06-04 NOTE — Telephone Encounter (Signed)
Notified pt and scheduled f/u for 07/02/14 at 1:45pm. Rx placed at front desk for pick up.

## 2014-06-04 NOTE — Telephone Encounter (Signed)
Attempted to reach pt and was unable to leave message on cell or work #.  Will try again later.

## 2014-07-02 ENCOUNTER — Encounter: Payer: Self-pay | Admitting: Family

## 2014-07-02 ENCOUNTER — Ambulatory Visit (INDEPENDENT_AMBULATORY_CARE_PROVIDER_SITE_OTHER): Payer: BLUE CROSS/BLUE SHIELD | Admitting: Family

## 2014-07-02 VITALS — BP 113/73 | HR 73 | Temp 98.2°F | Resp 16 | Ht 62.5 in | Wt 150.0 lb

## 2014-07-02 DIAGNOSIS — F902 Attention-deficit hyperactivity disorder, combined type: Secondary | ICD-10-CM

## 2014-07-02 DIAGNOSIS — Z91048 Other nonmedicinal substance allergy status: Secondary | ICD-10-CM

## 2014-07-02 DIAGNOSIS — Z9109 Other allergy status, other than to drugs and biological substances: Secondary | ICD-10-CM

## 2014-07-02 MED ORDER — AMPHETAMINE-DEXTROAMPHET ER 15 MG PO CP24: 15.0000 mg | ORAL_CAPSULE | ORAL | Status: DC

## 2014-07-02 MED ORDER — CETIRIZINE HCL 10 MG PO TABS: 10.0000 mg | ORAL_TABLET | Freq: Every day | ORAL | Status: DC

## 2014-07-02 MED ORDER — HYDROCORTISONE ACE-PRAMOXINE 1-1 % RE FOAM: 1.0000 | Freq: Two times a day (BID) | RECTAL | Status: DC | PRN

## 2014-07-02 MED ORDER — TRETINOIN 0.05 % EX CREA: TOPICAL_CREAM | Freq: Every day | CUTANEOUS | Status: AC

## 2014-07-02 MED ORDER — ALBUTEROL SULFATE HFA 108 (90 BASE) MCG/ACT IN AERS: 2.0000 | INHALATION_SPRAY | Freq: Four times a day (QID) | RESPIRATORY_TRACT | Status: DC | PRN

## 2014-07-02 MED ORDER — IBUPROFEN 600 MG PO TABS: ORAL_TABLET | ORAL | Status: DC

## 2014-07-02 MED ORDER — AMPHETAMINE-DEXTROAMPHET ER 20 MG PO CP24: 20.0000 mg | ORAL_CAPSULE | Freq: Every day | ORAL | Status: DC

## 2014-07-02 NOTE — Progress Notes (Signed)
Pre visit review using our clinic review tool, if applicable. No additional management support is needed unless otherwise documented below in the visit note. 

## 2014-07-02 NOTE — Assessment & Plan Note (Signed)
Uncontrolled, increase adderall xr from 15 to 20mg .

## 2014-07-02 NOTE — Patient Instructions (Addendum)
Add zyrtec 10mg  once daily. You may use albuterol as needed for cough/wheezing, or shorntess of breath. Increase adderall to 20mg  once daily. You'll be contacted re: allergy referral. Follow up in 2 months.

## 2014-07-02 NOTE — Progress Notes (Signed)
Subjective:    Patient ID: Kristin Sandoval, female    DOB: 1986/04/08, 29 y.o.   MRN: [id number]  HPI   Kristin Sandoval is a 29 yr old female who presents today for follow up.  1) ADHD- currently on adderall xr.reports that Kristin Sandoval has had increase stress lately.  Notes some issues with concentration recently. Not working as well as it did previously   2) Allergies- reports that has allergy to straw and cats.  Has animals in a barn, throat itches/face burns. Uses straw in the barn.  Hay does  Not bother her.  Kristin Sandoval has tried benadryl which stops her sneezing, notes that it does not help her other symptoms.  Tried zyrtec but it was only after Kristin Sandoval developed symptoms and it did not help much.       Review of Systems See HPI  Past Medical History  Diagnosis Date  . Asthma     childhood    History   Social History  . Marital Status: Single    Spouse Name: N/A    Number of Children: 0  . Years of Education: N/A   Occupational History  .  [employer]   Social History Main Topics  . Smoking status: Former Smoker    Types: Cigarettes    Quit date: 06/30/2006  . Smokeless tobacco: Not on file  . Alcohol Use: Yes     Comment: 0-7 drinks weekly.  . Drug Use: No  . Sexual Activity: Not on file   Other Topics Concern  . Not on file   Social History Narrative   Caffeine Use:  1 cup coffee daily   Regular exercise:  4 x weekly          Past Surgical History  Procedure Laterality Date  . Adenoidectomy      29 yrs old  . Breast enhancement surgery Bilateral 2014  . Tonsillectomy  4/15    due to tonsil stones    Family History  Problem Relation Age of Onset  . Alcohol abuse Father   . Arthritis Maternal Uncle   . Hyperlipidemia Maternal Uncle   . Alcohol abuse Paternal Aunt   . Arthritis Maternal Grandmother     DDD, spinal stenosis  . Hyperlipidemia Maternal Grandmother   . Alcohol abuse Maternal Grandfather   . Alcohol abuse Paternal Grandfather     Allergies  Allergen  Reactions  . Apple Shortness Of Breath  . Acrylic Polymer [Carbomer]     Loses nail  . Other Other (See Comments)    STRAW / HAY.  SEVERE  ITCHING, CONGESTION.  . Adhesive [Tape] Rash  . Mango Flavor Rash    Current Outpatient Prescriptions on File Prior to Visit  Medication Sig Dispense Refill  . EPINEPHrine (EPIPEN) 0.3 mg/0.3 mL SOAJ injection Inject 0.3 mLs (0.3 mg total) into the muscle once. 2 Device 0  . Melatonin 3 MG TABS Take 1 tablet by mouth at bedtime as needed.    . valACYclovir (VALTREX) 500 MG tablet Take 1 tablet (500 mg total) by mouth daily as needed. 30 tablet 0   No current facility-administered medications on file prior to visit.    BP 113/73 mmHg  Pulse 73  Temp(Src) 98.2 F (36.8 C) (Oral)  Resp 16  Ht 5' 2.5" (1.588 m)  Wt 150 lb (68.04 kg)  BMI 26.98 kg/m2  SpO2 100%  LMP 07/01/2014       Objective:   Physical Exam  Constitutional: Kristin Sandoval is oriented  to person, place, and time. Kristin Sandoval appears well-developed and well-nourished.  Eyes: Left eye exhibits no discharge. No scleral icterus.  Neurological: Kristin Sandoval is alert and oriented to person, place, and time.  Psychiatric: Kristin Sandoval has a normal mood and affect. Her behavior is normal. Judgment and thought content normal.          Assessment & Plan:  15 min spent with pt today.  >50% of this time was spent counseling pt on adhd and allergy treatment

## 2014-07-02 NOTE — Assessment & Plan Note (Signed)
Advised pt to take zyrtec every day.  Will also refer to allergist as I think she may benefit from allergy shots.

## 2014-09-03 ENCOUNTER — Ambulatory Visit: Payer: BLUE CROSS/BLUE SHIELD | Admitting: Family

## 2014-09-04 ENCOUNTER — Encounter: Payer: Self-pay | Admitting: Family

## 2014-09-04 ENCOUNTER — Telehealth: Payer: Self-pay | Admitting: Family

## 2014-09-04 NOTE — Telephone Encounter (Signed)
Pt was no show for appt on 09/03/14- letter sent- charge no show?

## 2014-09-04 NOTE — Telephone Encounter (Signed)
Yes tks

## 2014-10-27 ENCOUNTER — Ambulatory Visit (INDEPENDENT_AMBULATORY_CARE_PROVIDER_SITE_OTHER): Payer: BLUE CROSS/BLUE SHIELD | Admitting: Family

## 2014-10-27 ENCOUNTER — Encounter: Payer: Self-pay | Admitting: Family

## 2014-10-27 VITALS — BP 100/70 | HR 75 | Temp 98.2°F | Resp 16 | Ht 62.5 in | Wt 155.2 lb

## 2014-10-27 DIAGNOSIS — R3915 Urgency of urination: Secondary | ICD-10-CM | POA: Diagnosis not present

## 2014-10-27 DIAGNOSIS — N912 Amenorrhea, unspecified: Secondary | ICD-10-CM

## 2014-10-27 LAB — POCT URINALYSIS DIPSTICK
Bilirubin, UA: NEGATIVE
Glucose, UA: NEGATIVE
Ketones, UA: NEGATIVE
Leukocytes, UA: NEGATIVE
NITRITE UA: NEGATIVE
PH UA: 6
Protein, UA: NEGATIVE
RBC UA: NEGATIVE
Spec Grav, UA: 1.015
Urobilinogen, UA: NEGATIVE

## 2014-10-27 LAB — POCT URINE HCG BY VISUAL COLOR COMPARISON TESTS: Preg Test, Ur: NEGATIVE

## 2014-10-27 NOTE — Assessment & Plan Note (Addendum)
Urine HCG in office today is negative. Will send for serum HCG.  We discussed adding a more reliable form of birth control as she does not wish to become pregnant, but she declines at this time.

## 2014-10-27 NOTE — Progress Notes (Signed)
Subjective:    Patient ID: Kristin Sandoval, female    DOB: 04-25-1986, 29 y.o.   MRN: [id number]  HPI  Ms. Pernice is a 29 yr old female who presents today to discuss missed menstrual cycle.  Reports LMP was 08/28/14, might have spotted in the end of march. Reports + fatigue. Denies  Has had some added stress recently. Uses condoms all the time.   Had implanon out since November.  Denies nausea, breast tenderness.     Review of Systems See HPI  Past Medical History  Diagnosis Date  . Asthma     childhood    History   Social History  . Marital Status: Single    Spouse Name: N/A  . Number of Children: 0  . Years of Education: N/A   Occupational History  .  [employer]   Social History Main Topics  . Smoking status: Former Smoker    Types: Cigarettes    Quit date: 06/30/2006  . Smokeless tobacco: Not on file  . Alcohol Use: Yes     Comment: 0-7 drinks weekly.  . Drug Use: No  . Sexual Activity: Not on file   Other Topics Concern  . Not on file   Social History Narrative   Caffeine Use:  1 cup coffee daily   Regular exercise:  4 x weekly          Past Surgical History  Procedure Laterality Date  . Adenoidectomy      29 yrs old  . Breast enhancement surgery Bilateral 2014  . Tonsillectomy  4/15    due to tonsil stones    Family History  Problem Relation Age of Onset  . Alcohol abuse Father   . Arthritis Maternal Uncle   . Hyperlipidemia Maternal Uncle   . Alcohol abuse Paternal Aunt   . Arthritis Maternal Grandmother     DDD, spinal stenosis  . Hyperlipidemia Maternal Grandmother   . Alcohol abuse Maternal Grandfather   . Alcohol abuse Paternal Grandfather     Allergies  Allergen Reactions  . Apple Shortness Of Breath  . Acrylic Polymer [Carbomer]     Loses nail  . Other Other (See Comments)    STRAW / HAY.  SEVERE  ITCHING, CONGESTION.  . Adhesive [Tape] Rash  . Mango Flavor Rash    Current Outpatient Prescriptions on File Prior to Visit    Medication Sig Dispense Refill  . albuterol (PROVENTIL HFA;VENTOLIN HFA) 108 (90 BASE) MCG/ACT inhaler Inhale 2 puffs into the lungs every 6 (six) hours as needed for wheezing or shortness of breath. 1 Inhaler 2  . EPINEPHrine (EPIPEN) 0.3 mg/0.3 mL SOAJ injection Inject 0.3 mLs (0.3 mg total) into the muscle once. 2 Device 0  . hydrocortisone-pramoxine (PROCTOFOAM-HC) rectal foam Place 1 applicator rectally 2 (two) times daily as needed. 10 g 2  . ibuprofen (ADVIL,MOTRIN) 600 MG tablet 1 tab po q8h prn at onset of headache 30 tablet 3  . tretinoin (RETIN-A) 0.05 % cream Apply topically at bedtime. 45 g 0  . valACYclovir (VALTREX) 500 MG tablet Take 1 tablet (500 mg total) by mouth daily as needed. 30 tablet 0   No current facility-administered medications on file prior to visit.    BP 100/70 mmHg  Pulse 75  Temp(Src) 98.2 F (36.8 C) (Oral)  Resp 16  Ht 5' 2.5" (1.588 m)  Wt 155 lb 3.2 oz (70.398 kg)  BMI 27.92 kg/m2  SpO2 99%  LMP 08/19/2014  Objective:   Physical Exam  Constitutional: She appears well-developed and well-nourished.  HENT:  Head: Normocephalic and atraumatic.  Cardiovascular: Normal rate, regular rhythm and normal heart sounds.   No murmur heard. Pulmonary/Chest: Effort normal and breath sounds normal. No respiratory distress. She has no wheezes.  Musculoskeletal: She exhibits no edema.  Psychiatric: She has a normal mood and affect. Her behavior is normal. Judgment and thought content normal.          Assessment & Plan:

## 2014-10-27 NOTE — Progress Notes (Signed)
Pre visit review using our clinic review tool, if applicable. No additional management support is needed unless otherwise documented below in the visit note. 

## 2014-10-27 NOTE — Patient Instructions (Signed)
Please complete lab work prior to leaving.  We will contact you with results.

## 2014-10-28 LAB — HCG, SERUM, QUALITATIVE: PREG SERUM: NEGATIVE

## 2015-03-03 ENCOUNTER — Other Ambulatory Visit: Payer: Self-pay

## 2015-03-04 LAB — CYTOLOGY - PAP

## 2015-03-09 ENCOUNTER — Telehealth: Payer: Self-pay | Admitting: *Deleted

## 2015-03-09 NOTE — Telephone Encounter (Signed)
Unable to reach patient at time of Pre-Visit Call.  Left message for patient to return call when available.    

## 2015-03-10 ENCOUNTER — Ambulatory Visit (INDEPENDENT_AMBULATORY_CARE_PROVIDER_SITE_OTHER): Payer: BLUE CROSS/BLUE SHIELD | Admitting: Family

## 2015-03-10 ENCOUNTER — Encounter: Payer: Self-pay | Admitting: Family

## 2015-03-10 VITALS — BP 118/70 | HR 69 | Temp 98.3°F | Resp 16 | Ht 63.0 in | Wt 152.0 lb

## 2015-03-10 DIAGNOSIS — Z0000 Encounter for general adult medical examination without abnormal findings: Secondary | ICD-10-CM

## 2015-03-10 DIAGNOSIS — H7293 Unspecified perforation of tympanic membrane, bilateral: Secondary | ICD-10-CM | POA: Diagnosis not present

## 2015-03-10 DIAGNOSIS — F902 Attention-deficit hyperactivity disorder, combined type: Secondary | ICD-10-CM

## 2015-03-10 DIAGNOSIS — Z23 Encounter for immunization: Secondary | ICD-10-CM

## 2015-03-10 LAB — LIPID PANEL
CHOL/HDL RATIO: 3
CHOLESTEROL: 172 mg/dL (ref 0–200)
HDL: 62.2 mg/dL (ref >39.00)
LDL CALC: 97 mg/dL (ref 0–99)
NonHDL: 109.84
TRIGLYCERIDES: 63 mg/dL (ref 0.0–149.0)
VLDL: 12.6 mg/dL (ref 0.0–40.0)

## 2015-03-10 LAB — CBC WITH DIFFERENTIAL/PLATELET
BASOS ABS: 0 10*3/uL (ref 0.0–0.1)
Basophils Relative: 0.4 % (ref 0.0–3.0)
EOS PCT: 2.5 % (ref 0.0–5.0)
Eosinophils Absolute: 0.2 10*3/uL (ref 0.0–0.7)
HEMATOCRIT: 40.9 % (ref 36.0–46.0)
Hemoglobin: 13.4 g/dL (ref 12.0–15.0)
LYMPHS ABS: 2.8 10*3/uL (ref 0.7–4.0)
LYMPHS PCT: 38.3 % (ref 12.0–46.0)
MCHC: 32.8 g/dL (ref 30.0–36.0)
MCV: 89 fl (ref 78.0–100.0)
MONOS PCT: 7.6 % (ref 3.0–12.0)
Monocytes Absolute: 0.6 10*3/uL (ref 0.1–1.0)
NEUTROS ABS: 3.7 10*3/uL (ref 1.4–7.7)
NEUTROS PCT: 51.2 % (ref 43.0–77.0)
PLATELETS: 273 10*3/uL (ref 150.0–400.0)
RBC: 4.6 Mil/uL (ref 3.87–5.11)
RDW: 14 % (ref 11.5–15.5)
WBC: 7.3 10*3/uL (ref 4.0–10.5)

## 2015-03-10 LAB — BASIC METABOLIC PANEL
BUN: 14 mg/dL (ref 6–23)
CALCIUM: 9.2 mg/dL (ref 8.4–10.5)
CO2: 29 meq/L (ref 19–32)
Chloride: 102 mEq/L (ref 96–112)
Creatinine, Ser: 0.83 mg/dL (ref 0.40–1.20)
GFR: 85.29 mL/min (ref >60.00)
GLUCOSE: 86 mg/dL (ref 70–99)
POTASSIUM: 4.4 meq/L (ref 3.5–5.1)
SODIUM: 137 meq/L (ref 135–145)

## 2015-03-10 LAB — URINALYSIS, ROUTINE W REFLEX MICROSCOPIC
Bilirubin Urine: NEGATIVE
Ketones, ur: NEGATIVE
Leukocytes, UA: NEGATIVE
Nitrite: NEGATIVE
PH: 6 (ref 5.0–8.0)
SPECIFIC GRAVITY, URINE: 1.025 (ref 1.000–1.030)
Total Protein, Urine: NEGATIVE
Urine Glucose: NEGATIVE
Urobilinogen, UA: 0.2 (ref 0.0–1.0)
WBC, UA: NONE SEEN (ref 0–?)

## 2015-03-10 LAB — HEPATIC FUNCTION PANEL
ALBUMIN: 4.3 g/dL (ref 3.5–5.2)
ALK PHOS: 57 U/L (ref 39–117)
ALT: 13 U/L (ref 0–35)
AST: 15 U/L (ref 0–37)
Bilirubin, Direct: 0.1 mg/dL (ref 0.0–0.3)
TOTAL PROTEIN: 7.4 g/dL (ref 6.0–8.3)
Total Bilirubin: 0.4 mg/dL (ref 0.2–1.2)

## 2015-03-10 LAB — TSH: TSH: 1.06 u[IU]/mL (ref 0.35–4.50)

## 2015-03-10 MED ORDER — AMPHETAMINE-DEXTROAMPHETAMINE 5 MG PO TABS: 5.0000 mg | ORAL_TABLET | Freq: Every day | ORAL | Status: DC

## 2015-03-10 NOTE — Assessment & Plan Note (Signed)
Discussed diet/exercise/weight loss.  Declines flu shot. Tdap today. Advised pt to schedule routine eye exam.

## 2015-03-10 NOTE — Progress Notes (Signed)
Pre visit review using our clinic review tool, if applicable. No additional management support is needed unless otherwise documented below in the visit note. 

## 2015-03-10 NOTE — Assessment & Plan Note (Signed)
She had insomnia and weight loss previously on adderall xr.  Will rx with standard release adderall in AM only to see if this helps her with her work day.  She is wanting to lose weight. Will monitor.  15 minutes spent counseling pt on depression/adhd today.  She is concerned that she may be having some depression symptoms. What she is describing sounds more like ADHD to me, so we will see how she feels on the adderall and re-evaluate in 1 month.

## 2015-03-10 NOTE — Progress Notes (Signed)
Subjective:    Patient ID: Kristin Sandoval, female    DOB: July 31, 1985, 29 y.o.   MRN: [id number]  HPI  [name] is a 29 yr old female who presents today for cpx.  Immunizations: declines flu shot.  Diet: diet is health Exercise: has started exercising more Wt Readings from Last 3 Encounters:  03/10/15 152 lb (68.947 kg)  10/27/14 155 lb 3.2 oz (70.398 kg)  07/02/14 150 lb (68.04 kg)  Pap Smear: 9/16- normal Dental:  Up to date Vision exam: never   ADHD- trouble concentrating.  Reports that she often feels scatterbrained at work. Feels like she is more impulsive- for example- can't hold her comments in meetings. Putting off work projects that she has had plenty of time to do.  Felt a little down after her recent move, but mood seems better.   Review of Systems  Constitutional: Negative for unexpected weight change.  HENT: Positive for rhinorrhea. Negative for hearing loss.   Eyes: Negative for visual disturbance.  Respiratory: Negative for cough.   Cardiovascular: Negative for chest pain.  Gastrointestinal: Negative for diarrhea and constipation.  Genitourinary: Negative for dysuria.       Seeing GYN for amenorrhea- had LMP 8/16  Musculoskeletal: Negative for myalgias and arthralgias.  Skin: Negative for rash.  Neurological: Negative for headaches.  Hematological: Negative for adenopathy.  Psychiatric/Behavioral:       See HPI   Past Medical History  Diagnosis Date  . Asthma     childhood    Social History   Social History  . Marital Status: Single    Spouse Name: N/A  . Number of Children: 0  . Years of Education: N/A   Occupational History  .  [employer]   Social History Main Topics  . Smoking status: Former Smoker    Types: Cigarettes    Quit date: 06/30/2006  . Smokeless tobacco: Not on file  . Alcohol Use: Yes     Comment: 0-7 drinks weekly.  . Drug Use: No  . Sexual Activity: Not on file   Other Topics Concern  . Not on file   Social History  Narrative   Caffeine Use:  1 cup coffee daily   Regular exercise:  4 x weekly          Past Surgical History  Procedure Laterality Date  . Adenoidectomy      29 yrs old  . Breast enhancement surgery Bilateral 2014  . Tonsillectomy  4/15    due to tonsil stones    Family History  Problem Relation Age of Onset  . Alcohol abuse Father   . Arthritis Maternal Uncle   . Hyperlipidemia Maternal Uncle   . Alcohol abuse Paternal Aunt   . Arthritis Maternal Grandmother     DDD, spinal stenosis  . Hyperlipidemia Maternal Grandmother   . Alcohol abuse Maternal Grandfather   . Alcohol abuse Paternal Grandfather     Allergies  Allergen Reactions  . Apple Shortness Of Breath  . Acrylic Polymer [Carbomer]     Loses nail  . Other Other (See Comments)    STRAW / HAY.  SEVERE  ITCHING, CONGESTION.  . Adhesive [Tape] Rash  . Mango Flavor Rash    Current Outpatient Prescriptions on File Prior to Visit  Medication Sig Dispense Refill  . albuterol (PROVENTIL HFA;VENTOLIN HFA) 108 (90 BASE) MCG/ACT inhaler Inhale 2 puffs into the lungs every 6 (six) hours as needed for wheezing or shortness of breath. 1 Inhaler 2  .  hydrocortisone-pramoxine (PROCTOFOAM-HC) rectal foam Place 1 applicator rectally 2 (two) times daily as needed. 10 g 2  . ibuprofen (ADVIL,MOTRIN) 600 MG tablet 1 tab po q8h prn at onset of headache 30 tablet 3  . tretinoin (RETIN-A) 0.05 % cream Apply topically at bedtime. 45 g 0  . valACYclovir (VALTREX) 500 MG tablet Take 1 tablet (500 mg total) by mouth daily as needed. 30 tablet 0   No current facility-administered medications on file prior to visit.    BP 118/70 mmHg  Pulse 69  Temp(Src) 98.3 F (36.8 C) (Oral)  Resp 16  Ht 5\' 3"  (1.6 m)  Wt 152 lb (68.947 kg)  BMI 26.93 kg/m2  SpO2 99%  LMP 01/21/2015        Objective:   Physical Exam  Physical Exam  Constitutional: She is oriented to person, place, and time. She appears well-developed and  well-nourished. No distress.  HENT:  Head: Normocephalic and atraumatic.  Right Ear: Tympanic membrane with small perforation and ear canal normal.  Left Ear: Tympanic membrane with small perforation and ear canal normal.  Mouth/Throat: Oropharynx is clear and moist.  Eyes: Pupils are equal, round, and reactive to light. No scleral icterus.  Neck: Normal range of motion. No thyromegaly present.  Cardiovascular: Normal rate and regular rhythm.   No murmur heard. Pulmonary/Chest: Effort normal and breath sounds normal. No respiratory distress. He has no wheezes. She has no rales. She exhibits no tenderness.  Abdominal: Soft. Bowel sounds are normal. He exhibits no distension and no mass. There is no tenderness. There is no rebound and no guarding.  Musculoskeletal: She exhibits no edema.  Lymphadenopathy:    She has no cervical adenopathy.  Neurological: She is alert and oriented to person, place, and time. She exhibits normal muscle tone. Coordination normal.  Skin: Skin is warm and dry.  Psychiatric: She has a normal mood and affect. Her behavior is normal. Judgment and thought content normal.  Breast/pelvic:  deferred   Assessment & Plan:         Assessment & Plan:  Bilateral ear perforation- refer to Dr. Redmond Baseman ENT- she has seen him before.

## 2015-03-10 NOTE — Patient Instructions (Signed)
Start adderall 5 mg once daily. Follow up in 1 month.

## 2015-03-19 ENCOUNTER — Telehealth: Payer: Self-pay | Admitting: *Deleted

## 2015-03-19 NOTE — Telephone Encounter (Signed)
Wellness form received and forwarded to [facility]. JG//CMA

## 2015-03-25 NOTE — Telephone Encounter (Signed)
Completed form emailed to pt at [email or web address]. Sent for scanning. [email or web address]

## 2015-04-14 ENCOUNTER — Encounter: Payer: Self-pay | Admitting: Family

## 2015-04-14 ENCOUNTER — Ambulatory Visit (INDEPENDENT_AMBULATORY_CARE_PROVIDER_SITE_OTHER): Payer: BLUE CROSS/BLUE SHIELD | Admitting: Family

## 2015-04-14 VITALS — BP 112/75 | HR 80 | Temp 98.4°F | Resp 16 | Ht 63.0 in | Wt 154.2 lb

## 2015-04-14 DIAGNOSIS — K648 Other hemorrhoids: Secondary | ICD-10-CM

## 2015-04-14 DIAGNOSIS — H7293 Unspecified perforation of tympanic membrane, bilateral: Secondary | ICD-10-CM

## 2015-04-14 DIAGNOSIS — F902 Attention-deficit hyperactivity disorder, combined type: Secondary | ICD-10-CM

## 2015-04-14 DIAGNOSIS — K649 Unspecified hemorrhoids: Secondary | ICD-10-CM | POA: Diagnosis not present

## 2015-04-14 DIAGNOSIS — K644 Residual hemorrhoidal skin tags: Secondary | ICD-10-CM

## 2015-04-14 MED ORDER — AMPHETAMINE-DEXTROAMPHETAMINE 10 MG PO TABS: 10.0000 mg | ORAL_TABLET | Freq: Two times a day (BID) | ORAL | Status: DC

## 2015-04-14 NOTE — Assessment & Plan Note (Signed)
Refer to surgeon for consult.

## 2015-04-14 NOTE — Progress Notes (Signed)
Subjective:    Patient ID: Kristin Sandoval, female    DOB: 10-03-1985, 29 y.o.   MRN: [id number]  HPI  Kristin Sandoval is a 29 yr old female who presents today for follow up of her ADHD.  She was started last visit on Adderall 5mg  once daily in the AM. Notes no improvement in her being "frazzled." No insomnia on this preparation given once a day.  No weight loss.  Wt Readings from Last 3 Encounters:  04/14/15 154 lb 3.2 oz (69.945 kg)  03/10/15 152 lb (68.947 kg)  10/27/14 155 lb 3.2 oz (70.398 kg)   Hemorrhoids- would like referral for possible hemorrhoid surgery- notes no improvement in her symptoms with the proctofoam.   TM perforations- has not scheduled ENT appointment. Would like to know if the "holes have healed yet."    Review of Systems See HPI  Past Medical History  Diagnosis Date  . Asthma     childhood    Social History   Social History  . Marital Status: Single    Spouse Name: N/A  . Number of Children: 0  . Years of Education: N/A   Occupational History  .  [employer]   Social History Main Topics  . Smoking status: Former Smoker    Types: Cigarettes    Quit date: 06/30/2006  . Smokeless tobacco: Not on file  . Alcohol Use: Yes     Comment: 0-7 drinks weekly.  . Drug Use: No  . Sexual Activity: Not on file   Other Topics Concern  . Not on file   Social History Narrative   Caffeine Use:  1 cup coffee daily   Regular exercise:  4 x weekly          Past Surgical History  Procedure Laterality Date  . Adenoidectomy      29 yrs old  . Breast enhancement surgery Bilateral 2014  . Tonsillectomy  4/15    due to tonsil stones    Family History  Problem Relation Age of Onset  . Alcohol abuse Father   . Arthritis Maternal Uncle   . Hyperlipidemia Maternal Uncle   . Alcohol abuse Paternal Aunt   . Arthritis Maternal Grandmother     DDD, spinal stenosis  . Hyperlipidemia Maternal Grandmother   . Alcohol abuse Maternal Grandfather   . Alcohol abuse  Paternal Grandfather     Allergies  Allergen Reactions  . Apple Shortness Of Breath  . Acrylic Polymer [Carbomer]     Loses nail  . Other Other (See Comments)    STRAW / HAY.  SEVERE  ITCHING, CONGESTION.  . Adhesive [Tape] Rash  . Mango Flavor Rash    Current Outpatient Prescriptions on File Prior to Visit  Medication Sig Dispense Refill  . albuterol (PROVENTIL HFA;VENTOLIN HFA) 108 (90 BASE) MCG/ACT inhaler Inhale 2 puffs into the lungs every 6 (six) hours as needed for wheezing or shortness of breath. 1 Inhaler 2  . amphetamine-dextroamphetamine (ADDERALL) 5 MG tablet Take 1 tablet (5 mg total) by mouth daily. 30 tablet 0  . hydrocortisone-pramoxine (PROCTOFOAM-HC) rectal foam Place 1 applicator rectally 2 (two) times daily as needed. 10 g 2  . ibuprofen (ADVIL,MOTRIN) 600 MG tablet 1 tab po q8h prn at onset of headache 30 tablet 3  . tretinoin (RETIN-A) 0.05 % cream Apply topically at bedtime. 45 g 0  . valACYclovir (VALTREX) 500 MG tablet Take 1 tablet (500 mg total) by mouth daily as needed. 30 tablet 0  No current facility-administered medications on file prior to visit.    BP 112/75 mmHg  Pulse 80  Temp(Src) 98.4 F (36.9 C) (Oral)  Resp 16  Ht 5\' 3"  (1.6 m)  Wt 154 lb 3.2 oz (69.945 kg)  BMI 27.32 kg/m2  SpO2 100%  LMP 01/21/2015       Objective:   Physical Exam  Constitutional: She appears well-developed and well-nourished.  HENT:  Bilateral TM perforations noted  Cardiovascular: Normal rate, regular rhythm and normal heart sounds.   No murmur heard. Pulmonary/Chest: Effort normal and breath sounds normal. No respiratory distress. She has no wheezes.  Genitourinary:  Refused rectal exam  Musculoskeletal: She exhibits no edema.  Psychiatric: She has a normal mood and affect. Her behavior is normal. Judgment and thought content normal.          Assessment & Plan:  Bilateral TM perforation- advised pt to schedule consult with ENT (Dr. Redmond Baseman).

## 2015-04-14 NOTE — Progress Notes (Signed)
Pre visit review using our clinic review tool, if applicable. No additional management support is needed unless otherwise documented below in the visit note. 

## 2015-04-14 NOTE — Assessment & Plan Note (Signed)
Uncontrolled. Increase adderall to 10mg  once daily in the AM.

## 2015-04-14 NOTE — Patient Instructions (Addendum)
Increase your adderall to 10mg  once daily. You will be contacted about your referral to the surgeon. Please schedule your appointment with ENT.

## 2015-05-12 ENCOUNTER — Other Ambulatory Visit: Payer: Self-pay | Admitting: General Surgery

## 2015-05-12 NOTE — H&P (Signed)
History of Present Illness Leighton Ruff MD; [id number] 11:44 AM) Patient words: hems.  The patient is a 29 year old female who presents with hemorrhoids. 29 year old female with a history of constipation who presents to the office with bleeding after bowel movements. This is been going on for several years. Some days are worse than others. Most the time the bleeding is painless. She has an episode of constipation approximately once a month which requires straining. She then notes several days of pain afterwards. She has tried Proctofoam for approximately 3 months. She used it on a daily basis for several weeks and then switched to when necessary. She has noticed no real changes in her symptoms.   Other Problems Marjean Donna, [place]; 05/12/2015 11:26 AM) Asthma Melanoma  Past Surgical History Marjean Donna, [place]; 05/12/2015 11:26 AM) Breast Augmentation Bilateral. Tonsillectomy  Diagnostic Studies History Marjean Donna, CMA; 05/12/2015 11:26 AM) Colonoscopy never Mammogram never Pap Smear 1-5 years ago  Allergies Davy Pique Bynum, CMA; 05/12/2015 11:30 AM) Acryline 2 *MEDICAL DEVICES AND SUPPLIES* Adhesive 1"x6yd *MEDICAL DEVICES AND SUPPLIES* Mango Flavor *PHARMACEUTICAL ADJUVANTS* Apple Cider Vinegar *ALTERNATIVE MEDICINES*  Medication History Marjean Donna, CMA; 05/12/2015 11:31 AM) Amphetamine-Dextroamphetamine (10MG  Tablet, Oral) Active. Junel FE 1/20 (1-20MG -MCG Tablet, Oral) Active. Valtrex (500MG  Tablet, Oral) Active. Adderall (10MG  Tablet, Oral) Active. Albuterol (90MCG/ACT Aerosol Soln, Inhalation) Active. Medications Reconciled  Social History ([place]; 05/12/2015 11:26 AM) Alcohol use Occasional alcohol use. Caffeine use Coffee, Tea. No drug use Tobacco use Former smoker.  Family History Marjean Donna, [place]; 05/12/2015 11:26 AM) Alcohol Abuse Father.  Pregnancy / Birth History Marjean Donna, [place]; 05/12/2015 11:26 AM) Age at  menarche 55 years. Contraceptive History Contraceptive implant, Oral contraceptives. Gravida 0 Irregular periods Para 0     Review of Systems (Sonya Bynum CMA; 05/12/2015 11:26 AM) General Not Present- Appetite Loss, Chills, Fatigue, Fever, Night Sweats, Weight Gain and Weight Loss. Skin Not Present- Change in Wart/Mole, Dryness, Hives, Jaundice, New Lesions, Non-Healing Wounds, Rash and Ulcer. HEENT Not Present- Earache, Hearing Loss, Hoarseness, Nose Bleed, Oral Ulcers, Ringing in the Ears, Seasonal Allergies, Sinus Pain, Sore Throat, Visual Disturbances, Wears glasses/contact lenses and Yellow Eyes. Respiratory Not Present- Bloody sputum, Chronic Cough, Difficulty Breathing, Snoring and Wheezing. Breast Not Present- Breast Mass, Breast Pain, Nipple Discharge and Skin Changes. Cardiovascular Not Present- Chest Pain, Difficulty Breathing Lying Down, Leg Cramps, Palpitations, Rapid Heart Rate, Shortness of Breath and Swelling of Extremities. Gastrointestinal Present- Hemorrhoids. Not Present- Abdominal Pain, Bloating, Bloody Stool, Change in Bowel Habits, Chronic diarrhea, Constipation, Difficulty Swallowing, Excessive gas, Gets full quickly at meals, Indigestion, Nausea, Rectal Pain and Vomiting. Female Genitourinary Not Present- Frequency, Nocturia, Painful Urination, Pelvic Pain and Urgency. Musculoskeletal Not Present- Back Pain, Joint Pain, Joint Stiffness, Muscle Pain, Muscle Weakness and Swelling of Extremities. Neurological Not Present- Decreased Memory, Fainting, Headaches, Numbness, Seizures, Tingling, Tremor, Trouble walking and Weakness. Psychiatric Not Present- Anxiety, Bipolar, Change in Sleep Pattern, Depression, Fearful and Frequent crying. Endocrine Not Present- Cold Intolerance, Excessive Hunger, Hair Changes, Heat Intolerance, Hot flashes and New Diabetes. Hematology Not Present- Easy Bruising, Excessive bleeding, Gland problems, HIV and Persistent Infections.  Vitals  (Sonya Bynum CMA; 05/12/2015 11:28 AM) 05/12/2015 11:27 AM Weight: 155 lb Height: 65in Body Surface Area: 1.78 m Body Mass Index: 25.79 kg/m  Temp.: 94F(Temporal)  Pulse: 77 (Regular)  BP: 124/78 (Sitting, Left Arm, Standard)      Physical Exam Leighton Ruff MD; [id number] 11:52 AM)  General Mental Status-Alert. General Appearance-Not in acute distress.  Build & Nutrition-Well nourished. Posture-Normal posture. Gait-Normal.  Head and Neck Head-normocephalic, atraumatic with no lesions or palpable masses. Trachea-midline.  Chest and Lung Exam Chest and lung exam reveals -on auscultation, normal breath sounds, no adventitious sounds and normal vocal resonance.  Cardiovascular Cardiovascular examination reveals -normal heart sounds, regular rate and rhythm with no murmurs and femoral artery auscultation bilaterally reveals normal pulses, no bruits, no thrills.  Abdomen Inspection Inspection of the abdomen reveals - No Hernias. Palpation/Percussion Palpation and Percussion of the abdomen reveal - Soft, Non Tender, No Rigidity (guarding) and No Palpable abdominal masses.  Neurologic Neurologic evaluation reveals -alert and oriented x 3 with no impairment of recent or remote memory, normal attention span and ability to concentrate, normal sensation and normal coordination.  Musculoskeletal Normal Exam - Bilateral-Upper Extremity Strength Normal and Lower Extremity Strength Normal.    Assessment & Plan Leighton Ruff MD; [id number] 11:53 AM)  RECTAL BLEEDING (K62.5) Impression: 29 year old female with rectal bleeding. Anoscopy does not show an obvious source of her bleeding. I have recommended a colonoscopy to further evaluate this. If this is negative, we can remove her skin tags in the office under local anesthesia.

## 2015-05-27 ENCOUNTER — Telehealth: Payer: Self-pay | Admitting: Family

## 2015-05-27 MED ORDER — BUPROPION HCL ER (XL) 150 MG PO TB24: 150.0000 mg | ORAL_TABLET | Freq: Every day | ORAL | Status: DC

## 2015-05-27 NOTE — Telephone Encounter (Signed)
Relation to [id number] Call back number:[phone] Pharmacy: [facility] [address], [place] [address] :[phone]    Reason for call:  Patient requesting a refill buPROPion (WELLBUTRIN) 75 MG tablet

## 2015-05-27 NOTE — Telephone Encounter (Signed)
Restart Wellbutrin xl, follow up in 3-4 weeks.

## 2015-05-27 NOTE — Telephone Encounter (Signed)
Spoke with pt. States she has a lot of stress / anxiety in her personal life at this time. She and spouse are persuing custody of her stepchild and she is requesting to start Bupropion again.  Please advise.

## 2015-05-27 NOTE — Telephone Encounter (Signed)
Notified pt and she scheduled appt for 06/23/15 at 2pm.

## 2015-06-22 ENCOUNTER — Other Ambulatory Visit: Payer: Self-pay | Admitting: Family

## 2015-06-23 ENCOUNTER — Other Ambulatory Visit: Payer: Self-pay | Admitting: Family

## 2015-06-23 ENCOUNTER — Ambulatory Visit (INDEPENDENT_AMBULATORY_CARE_PROVIDER_SITE_OTHER): Payer: BLUE CROSS/BLUE SHIELD | Admitting: Family

## 2015-06-23 ENCOUNTER — Encounter: Payer: Self-pay | Admitting: Family

## 2015-06-23 VITALS — BP 131/85 | HR 77 | Temp 98.5°F | Resp 16 | Ht 63.0 in | Wt 157.8 lb

## 2015-06-23 DIAGNOSIS — F902 Attention-deficit hyperactivity disorder, combined type: Secondary | ICD-10-CM | POA: Diagnosis not present

## 2015-06-23 DIAGNOSIS — F32A Depression, unspecified: Secondary | ICD-10-CM

## 2015-06-23 DIAGNOSIS — F329 Major depressive disorder, single episode, unspecified: Secondary | ICD-10-CM

## 2015-06-23 MED ORDER — JUNEL FE 1/20 1-20 MG-MCG PO TABS: 1.0000 | ORAL_TABLET | Freq: Every day | ORAL | Status: DC
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
## 2015-06-23 NOTE — Progress Notes (Signed)
Pre visit review using our clinic review tool, if applicable. No additional management support is needed unless otherwise documented below in the visit note. 

## 2015-06-23 NOTE — Patient Instructions (Signed)
Continue wellbutrin. Happy New Year!

## 2015-06-23 NOTE — Assessment & Plan Note (Signed)
OK to hold off on the adderall for now. She has some if she feels she needs to restart.

## 2015-06-23 NOTE — Assessment & Plan Note (Signed)
Stable on wellbutrin despite recent stressors. Continue Wellbutrin.

## 2015-07-20 ENCOUNTER — Telehealth: Payer: Self-pay | Admitting: Family

## 2015-07-20 MED ORDER — VALACYCLOVIR HCL 500 MG PO TABS: 500.0000 mg | ORAL_TABLET | Freq: Every day | ORAL | Status: DC | PRN

## 2015-07-20 NOTE — Telephone Encounter (Signed)
Ok to send as previous.

## 2015-07-20 NOTE — Telephone Encounter (Signed)
Rx sent 

## 2015-07-20 NOTE — Telephone Encounter (Signed)
Melissa-- it appears Rx has not been sent since 2015?Marland Kitchen Is it ok to send as previous (500mg  once daily as needed)?

## 2015-07-20 NOTE — Telephone Encounter (Signed)
[id number] - [place], [place] - [address]  Pt needing refill on valtrex, her is out of date. She currently has a cold sore and asked if we can send in today.

## 2015-07-27 ENCOUNTER — Ambulatory Visit (INDEPENDENT_AMBULATORY_CARE_PROVIDER_SITE_OTHER): Payer: BLUE CROSS/BLUE SHIELD | Admitting: Family

## 2015-07-27 ENCOUNTER — Encounter: Payer: Self-pay | Admitting: Family

## 2015-07-27 VITALS — BP 119/79 | HR 92 | Temp 98.4°F | Resp 16 | Ht 63.0 in | Wt 156.0 lb

## 2015-07-27 DIAGNOSIS — N390 Urinary tract infection, site not specified: Secondary | ICD-10-CM | POA: Diagnosis not present

## 2015-07-27 DIAGNOSIS — R300 Dysuria: Secondary | ICD-10-CM | POA: Diagnosis not present

## 2015-07-27 LAB — POCT URINALYSIS DIPSTICK
Bilirubin, UA: NEGATIVE
Glucose, UA: NEGATIVE
Ketones, UA: NEGATIVE
NITRITE UA: NEGATIVE
PH UA: 6
Protein, UA: NEGATIVE
Spec Grav, UA: 1.015
UROBILINOGEN UA: NEGATIVE

## 2015-07-27 MED ORDER — NITROFURANTOIN MONOHYD MACRO 100 MG PO CAPS: ORAL_CAPSULE | ORAL | Status: DC

## 2015-07-27 MED ORDER — IBUPROFEN 600 MG PO TABS: ORAL_TABLET | ORAL | Status: DC

## 2015-07-27 NOTE — Progress Notes (Signed)
Pre visit review using our clinic review tool, if applicable. No additional management support is needed unless otherwise documented below in the visit note. 

## 2015-07-27 NOTE — Patient Instructions (Signed)
Start macrodantin twice daily for 7 days. Then use macrodantin 1 tablet after intercourse to avoid urinary tract infection. Call if symptoms worsen, if fever >101 or if symptoms are not improved in 2-3 days.

## 2015-07-27 NOTE — Progress Notes (Signed)
Subjective:    Patient ID: Kristin Sandoval, female    DOB: 08/23/1985, 30 y.o.   MRN: [id number]  HPI  Kristin Sandoval is a 30 yr old female who presents today with c/o dysuria x 2 days. She reports 5 UTI's in the last 1 year.  Reports that her UTI's are post coital in nature.  Saw an associated bit of blood on the tissue today when she wiped. Using cystex with temporary improvement in her symptoms.   Review of Systems See HPI  Past Medical History  Diagnosis Date  . Asthma     childhood    Social History   Social History  . Marital Status: Single    Spouse Name: N/A  . Number of Children: 0  . Years of Education: N/A   Occupational History  .  [employer]   Social History Main Topics  . Smoking status: Former Smoker    Types: Cigarettes    Quit date: 06/30/2006  . Smokeless tobacco: Not on file  . Alcohol Use: Yes     Comment: 0-7 drinks weekly.  . Drug Use: No  . Sexual Activity: Not on file   Other Topics Concern  . Not on file   Social History Narrative   Caffeine Use:  1 cup coffee daily   Regular exercise:  4 x weekly          Past Surgical History  Procedure Laterality Date  . Adenoidectomy      30 yrs old  . Breast enhancement surgery Bilateral 2014  . Tonsillectomy  4/15    due to tonsil stones    Family History  Problem Relation Age of Onset  . Alcohol abuse Father   . Arthritis Maternal Uncle   . Hyperlipidemia Maternal Uncle   . Alcohol abuse Paternal Aunt   . Arthritis Maternal Grandmother     DDD, spinal stenosis  . Hyperlipidemia Maternal Grandmother   . Alcohol abuse Maternal Grandfather   . Alcohol abuse Paternal Grandfather     Allergies  Allergen Reactions  . Apple Shortness Of Breath  . Acrylic Polymer [Carbomer]     Loses nail  . Other Other (See Comments)    STRAW / HAY.  SEVERE  ITCHING, CONGESTION.  . Adhesive [Tape] Rash  . Mango Flavor Rash    Current Outpatient Prescriptions on File Prior to Visit  Medication Sig  Dispense Refill  . albuterol (PROVENTIL HFA;VENTOLIN HFA) 108 (90 BASE) MCG/ACT inhaler Inhale 2 puffs into the lungs every 6 (six) hours as needed for wheezing or shortness of breath. 1 Inhaler 2  . buPROPion (WELLBUTRIN XL) 150 MG 24 hr tablet TAKE 1 TABLET (150 MG TOTAL) BY MOUTH DAILY. 30 tablet 2  . ibuprofen (ADVIL,MOTRIN) 600 MG tablet 1 tab po q8h prn at onset of headache 30 tablet 3  . JUNEL FE 1/20 1-20 MG-MCG tablet Take 1 tablet by mouth daily. 3 Package 1  . tretinoin (RETIN-A) 0.05 % cream Apply topically at bedtime. 45 g 0  . valACYclovir (VALTREX) 500 MG tablet Take 1 tablet (500 mg total) by mouth daily as needed. 30 tablet 0   No current facility-administered medications on file prior to visit.    Ht 5\' 3"  (1.6 m)  Wt 156 lb (70.761 kg)  BMI 27.64 kg/m2       Objective:   Physical Exam  Constitutional: She appears well-developed and well-nourished.  Cardiovascular: Normal rate, regular rhythm and normal heart sounds.   No murmur  heard. Pulmonary/Chest: Effort normal and breath sounds normal. No respiratory distress. She has no wheezes.  Abdominal: There is no tenderness. There is no CVA tenderness.  Psychiatric: She has a normal mood and affect. Her behavior is normal. Judgment and thought content normal.          Assessment & Plan:  UTI-  UA notes 3+ blood and 3+ leuks.  UTI's are recurrent and tend to occur post coitus. rx with macrobid bid x 7 days then 1 tab after intercourse for prophylaxis.

## 2015-07-30 ENCOUNTER — Telehealth: Payer: Self-pay | Admitting: Family

## 2015-07-30 LAB — URINE CULTURE: Colony Count: >=100000

## 2015-07-30 MED ORDER — CIPROFLOXACIN HCL 500 MG PO TABS: 500.0000 mg | ORAL_TABLET | Freq: Two times a day (BID) | ORAL | Status: DC

## 2015-07-30 NOTE — Telephone Encounter (Signed)
Pt aware of instructions and verbalized understanding.

## 2015-07-30 NOTE — Telephone Encounter (Signed)
Received sensitivity report for urine culture.  Resistant to macrodantin. Please contact pt and advise her to d/c macrodantin, start cipro.  It is still ok for her to keep macrodantin on hand and take 1 tab after intercourse to prevent future UTI.

## 2015-07-31 ENCOUNTER — Telehealth: Payer: Self-pay | Admitting: Family

## 2015-07-31 NOTE — Telephone Encounter (Signed)
Kristin Sandoval with [facility]  There system was down briefly yesterday. Please resend RX for cipro.

## 2015-07-31 NOTE — Telephone Encounter (Signed)
John called and verbal was given for Cipro 500mg  twice a day for 3 days.

## 2015-08-11 ENCOUNTER — Encounter: Payer: Self-pay | Admitting: Family

## 2015-08-11 ENCOUNTER — Telehealth: Payer: Self-pay | Admitting: Family

## 2015-08-11 ENCOUNTER — Ambulatory Visit (INDEPENDENT_AMBULATORY_CARE_PROVIDER_SITE_OTHER): Payer: BLUE CROSS/BLUE SHIELD | Admitting: Family

## 2015-08-11 VITALS — BP 110/72 | HR 83 | Temp 98.2°F | Resp 16 | Ht 63.0 in | Wt 153.0 lb

## 2015-08-11 DIAGNOSIS — F32A Depression, unspecified: Secondary | ICD-10-CM

## 2015-08-11 DIAGNOSIS — F329 Major depressive disorder, single episode, unspecified: Secondary | ICD-10-CM | POA: Diagnosis not present

## 2015-08-11 DIAGNOSIS — F902 Attention-deficit hyperactivity disorder, combined type: Secondary | ICD-10-CM | POA: Diagnosis not present

## 2015-08-11 MED ORDER — AMPHETAMINE-DEXTROAMPHETAMINE 10 MG PO TABS: 10.0000 mg | ORAL_TABLET | Freq: Two times a day (BID) | ORAL | Status: DC

## 2015-08-11 MED ORDER — VENLAFAXINE HCL ER 75 MG PO CP24: 75.0000 mg | ORAL_CAPSULE | Freq: Every day | ORAL | Status: DC

## 2015-08-11 MED FILL — VENLAFAXINE HCL ER 75 MG CA: 75 | 30 days supply | Qty: 30 | Fill #0

## 2015-08-11 MED FILL — AMPHETAMINE SALTS 10 MG TAB: 10 | 30 days supply | Qty: 60 | Fill #0

## 2015-08-11 NOTE — Telephone Encounter (Signed)
Spoke with pt, states she has had hopeless thoughts x 1 month. Feels like "what's the point of life". Doesn't want to "deal with job or home". Has had increased agitation but denies active plan for suicide or harm for others. Per verbal from PCP, ok to bring in for evaluation at 1pm today. Appt scheduled and pt is in agreement.

## 2015-08-11 NOTE — Assessment & Plan Note (Signed)
Uncontrolled.  Restart adderall. We discussed importance of eating 3 nutritious meals a day. She verbalizes understanding.  Will monitor weight. She has a goal weight of 135.

## 2015-08-11 NOTE — Progress Notes (Signed)
Subjective:    Patient ID: Kristin Sandoval, female    DOB: Mar 23, 1986, 30 y.o.   MRN: [id number]  HPI  Kristin Sandoval is a 30 yr old female who presents today to discuss worsening depression symptoms.  She started wellbutrin back again in December.  At that time she was experiencing stress due to custody issues that her fiance was dealing with. She has since ended that engagement.  Not enjoying things like she used to at work or at home.  Feels hopeless. Overwhelmed by caring for her large house, farm animals and 3 acres of land. Over the last month feels like depression has become less controlled.  Reports that she missed Wednesday and Friday night doses of wellbutrin. Denies SI/HI. She notes that wellbutrin does curb her appetite but she is pleased with weight loss.  She is not currently taking adderall because of appetite concerns but notes that she is really struggling to focus at work.   Wt Readings from Last 3 Encounters:  08/11/15 153 lb (69.4 kg)  07/27/15 156 lb (70.761 kg)  06/23/15 157 lb 12.8 oz (71.578 kg)     Review of Systems    see HPI  Past Medical History  Diagnosis Date  . Asthma     childhood    Social History   Social History  . Marital Status: Single    Spouse Name: N/A  . Number of Children: 0  . Years of Education: N/A   Occupational History  .  [employer]   Social History Main Topics  . Smoking status: Former Smoker    Types: Cigarettes    Quit date: 06/30/2006  . Smokeless tobacco: Not on file  . Alcohol Use: Yes     Comment: 0-7 drinks weekly.  . Drug Use: No  . Sexual Activity: Not on file   Other Topics Concern  . Not on file   Social History Narrative   Caffeine Use:  1 cup coffee daily   Regular exercise:  4 x weekly          Past Surgical History  Procedure Laterality Date  . Adenoidectomy      30 yrs old  . Breast enhancement surgery Bilateral 2014  . Tonsillectomy  4/15    due to tonsil stones    Family History  Problem  Relation Age of Onset  . Alcohol abuse Father   . Arthritis Maternal Uncle   . Hyperlipidemia Maternal Uncle   . Alcohol abuse Paternal Aunt   . Arthritis Maternal Grandmother     DDD, spinal stenosis  . Hyperlipidemia Maternal Grandmother   . Alcohol abuse Maternal Grandfather   . Alcohol abuse Paternal Grandfather     Allergies  Allergen Reactions  . Apple Shortness Of Breath  . Acrylic Polymer [Carbomer]     Loses nail  . Other Other (See Comments)    STRAW / HAY.  SEVERE  ITCHING, CONGESTION.  . Adhesive [Tape] Rash  . Mango Flavor Rash    Current Outpatient Prescriptions on File Prior to Visit  Medication Sig Dispense Refill  . albuterol (PROVENTIL HFA;VENTOLIN HFA) 108 (90 BASE) MCG/ACT inhaler Inhale 2 puffs into the lungs every 6 (six) hours as needed for wheezing or shortness of breath. 1 Inhaler 2  . buPROPion (WELLBUTRIN XL) 150 MG 24 hr tablet TAKE 1 TABLET (150 MG TOTAL) BY MOUTH DAILY. 30 tablet 2  . ciprofloxacin (CIPRO) 500 MG tablet Take 1 tablet (500 mg total) by mouth 2 (two)  times daily. 6 tablet 0  . ibuprofen (ADVIL,MOTRIN) 600 MG tablet 1 tab po q8h prn at onset of headache 30 tablet 3  . JUNEL FE 1/20 1-20 MG-MCG tablet Take 1 tablet by mouth daily. 3 Package 1  . tretinoin (RETIN-A) 0.05 % cream Apply topically at bedtime. 45 g 0  . valACYclovir (VALTREX) 500 MG tablet Take 1 tablet (500 mg total) by mouth daily as needed. 30 tablet 0   No current facility-administered medications on file prior to visit.    BP 110/72 mmHg  Pulse 83  Temp(Src) 98.2 F (36.8 C) (Oral)  Resp 16  Ht 5\' 3"  (1.6 m)  Wt 153 lb (69.4 kg)  BMI 27.11 kg/m2  SpO2 99%  LMP 07/14/2015    Objective:   Physical Exam  Constitutional: She appears well-developed and well-nourished. No distress.  Psychiatric: Her behavior is normal. Judgment and thought content normal.  tearful          Assessment & Plan:

## 2015-08-11 NOTE — Patient Instructions (Addendum)
Please schedule an appointment with a therapist- [phone] Start effexor xr once daily. Continue Wellbutrin. Restart Adderall 10mg . Follow up in 1 month.

## 2015-08-11 NOTE — Assessment & Plan Note (Signed)
Uncontrolled. Continue wellbutrin. Add Effexor, advised pt to schedule an appointment to establish with a counselor to help her work through some of her stress.  She understands that she should to to the ER if she develops thoughts of hurting self/others.

## 2015-08-11 NOTE — Telephone Encounter (Signed)
Pt states she missed a couple doses of Wellbutrin and she is having some side effects. Not allergic reaction. She is requesting call asap on cell [phone].

## 2015-08-11 NOTE — Progress Notes (Signed)
Pre visit review using our clinic review tool, if applicable. No additional management support is needed unless otherwise documented below in the visit note. 

## 2015-09-01 ENCOUNTER — Other Ambulatory Visit: Payer: Self-pay | Admitting: Family

## 2015-11-26 ENCOUNTER — Telehealth: Payer: Self-pay | Admitting: Family

## 2015-11-26 NOTE — Telephone Encounter (Signed)
Last filled:  08/11/15 Amt:  60, 0 Last OV:

## 2015-11-26 NOTE — Telephone Encounter (Signed)
°  Relationship to patient: Self  Can be reached: [phone]   Reason for call: Request refill on amphetamine-dextroamphetamine (ADDERALL) 10 MG tablet [id number]

## 2015-11-27 ENCOUNTER — Other Ambulatory Visit: Payer: Self-pay | Admitting: Family

## 2015-11-27 NOTE — Telephone Encounter (Signed)
Needs OV please.

## 2015-11-27 NOTE — Telephone Encounter (Signed)
Left message to have pt return my call.

## 2015-11-27 NOTE — Telephone Encounter (Signed)
Melissa-- pt last seen 08/11/15 and advised to restart effexor and Adderall. Pt was advised to follow up in 1 month. Pt is past due for follow up and Rx has not been given since 08/11/15.  Please advise if ok to provide Rx. Also needs new CSC and no previous UDS on file?

## 2015-12-01 NOTE — Telephone Encounter (Signed)
See 11/26/15 patient email.

## 2015-12-07 ENCOUNTER — Encounter: Payer: Self-pay | Admitting: Family

## 2015-12-07 ENCOUNTER — Ambulatory Visit (INDEPENDENT_AMBULATORY_CARE_PROVIDER_SITE_OTHER): Payer: BLUE CROSS/BLUE SHIELD | Admitting: Family

## 2015-12-07 VITALS — BP 98/70 | HR 66 | Temp 99.0°F | Resp 16 | Ht 63.0 in | Wt 150.2 lb

## 2015-12-07 DIAGNOSIS — R591 Generalized enlarged lymph nodes: Secondary | ICD-10-CM

## 2015-12-07 DIAGNOSIS — F32A Depression, unspecified: Secondary | ICD-10-CM

## 2015-12-07 DIAGNOSIS — F902 Attention-deficit hyperactivity disorder, combined type: Secondary | ICD-10-CM | POA: Diagnosis not present

## 2015-12-07 DIAGNOSIS — R599 Enlarged lymph nodes, unspecified: Secondary | ICD-10-CM

## 2015-12-07 DIAGNOSIS — F329 Major depressive disorder, single episode, unspecified: Secondary | ICD-10-CM

## 2015-12-07 DIAGNOSIS — M5440 Lumbago with sciatica, unspecified side: Secondary | ICD-10-CM | POA: Diagnosis not present

## 2015-12-07 MED ORDER — AMPHETAMINE-DEXTROAMPHETAMINE 10 MG PO TABS: 10.0000 mg | ORAL_TABLET | Freq: Two times a day (BID) | ORAL | Status: DC

## 2015-12-07 NOTE — Assessment & Plan Note (Signed)
Stable off of meds.  Home situation is improved.  Feeling well. Monitor off of meds.

## 2015-12-07 NOTE — Patient Instructions (Signed)
Please follow up in 1 month so we can recheck your enlarged lymph node. Continue adderall.

## 2015-12-07 NOTE — Progress Notes (Signed)
Pre visit review using our clinic review tool, if applicable. No additional management support is needed unless otherwise documented below in the visit note. 

## 2015-12-07 NOTE — Assessment & Plan Note (Signed)
Improved, continue adderall.

## 2015-12-07 NOTE — Progress Notes (Signed)
Subjective:    Patient ID: Kristin Sandoval, female    DOB: [birth year], 30 y.o.   MRN: [id number]  HPI  Kristin Sandoval is 30 yr old female who presents today for follow up.  1) ADHD- previously uncontrolled, restarted adderall.  More focused now. She has been exercising and trying to work out. Reports that she tried the 10mg  in the AM, was not strong enough and she was unable to sleep after PM dose. Has been taking 15-20mg  in the AM and this is working. Not needing PM dose.   2) Depression- last visit she described worsening depression symptoms.  We added effexor to her wellbutrin and discussed having her establish with a therapist.  She ran out of medication around Easter.  Felt "numb" on the medication. Mood is improved.   3) Back Pain- slipped on hardwoods and had shooting pains down the right side into the leg.  Yesterday did  "hot and cold"  Not hurting today.  Has been using ibupofen prn which helped.   Wt Readings from Last 3 Encounters:  12/07/15 150 lb 3.2 oz (68.13 kg)  08/11/15 153 lb (69.4 kg)  07/27/15 156 lb (70.761 kg)   Lymph node- noted left lower back.  Reports that she also has enlarged lymph node "near my girl part."   Review of Systems See HPI  Past Medical History  Diagnosis Date  . Asthma     childhood     Social History   Social History  . Marital Status: Single    Spouse Name: N/A  . Number of Children: 0  . Years of Education: N/A   Occupational History  .  [employer]   Social History Main Topics  . Smoking status: Former Smoker    Types: Cigarettes    Quit date: 06/30/2006  . Smokeless tobacco: Not on file  . Alcohol Use: Yes     Comment: 0-7 drinks weekly.  . Drug Use: No  . Sexual Activity: Not on file   Other Topics Concern  . Not on file   Social History Narrative   Caffeine Use:  1 cup coffee daily   Regular exercise:  4 x weekly          Past Surgical History  Procedure Laterality Date  . Adenoidectomy      30 yrs old  . Breast  enhancement surgery Bilateral 2014  . Tonsillectomy  4/15    due to tonsil stones    Family History  Problem Relation Age of Onset  . Alcohol abuse Father   . Arthritis Maternal Uncle   . Hyperlipidemia Maternal Uncle   . Alcohol abuse Paternal Aunt   . Arthritis Maternal Grandmother     DDD, spinal stenosis  . Hyperlipidemia Maternal Grandmother   . Alcohol abuse Maternal Grandfather   . Alcohol abuse Paternal Grandfather     Allergies  Allergen Reactions  . Apple Shortness Of Breath  . Acrylic Polymer [Carbomer]     Loses nail  . Other Other (See Comments)    STRAW / HAY.  SEVERE  ITCHING, CONGESTION.  . Adhesive [Tape] Rash  . Mango Flavor Rash    Current Outpatient Prescriptions on File Prior to Visit  Medication Sig Dispense Refill  . ibuprofen (ADVIL,MOTRIN) 600 MG tablet 1 tab po q8h prn at onset of headache 30 tablet 3  . JUNEL FE 1/20 1-20 MG-MCG tablet Take 1 tablet by mouth daily. 3 Package 1  . PROAIR HFA 108 (90 Base)  MCG/ACT inhaler INHALE 2 PUFFS INTO THE LUNGS EVERY 6 (SIX) HOURS AS NEEDED FOR WHEEZING OR SHORTNESS OF BREATH. 8 Inhaler 0  . tretinoin (RETIN-A) 0.05 % cream Apply topically at bedtime. 45 g 0  . valACYclovir (VALTREX) 500 MG tablet TAKE 1 TABLET (500 MG TOTAL) BY MOUTH DAILY AS NEEDED. 30 tablet 11  . amphetamine-dextroamphetamine (ADDERALL) 10 MG tablet Take 1 tablet (10 mg total) by mouth 2 (two) times daily with a meal. (Patient not taking: Reported on 12/07/2015) 60 tablet 0  . buPROPion (WELLBUTRIN XL) 150 MG 24 hr tablet TAKE 1 TABLET (150 MG TOTAL) BY MOUTH DAILY. (Patient not taking: Reported on 12/07/2015) 30 tablet 2  . venlafaxine XR (EFFEXOR XR) 75 MG 24 hr capsule Take 1 capsule (75 mg total) by mouth daily with breakfast. (Patient not taking: Reported on 12/07/2015) 30 capsule 0   No current facility-administered medications on file prior to visit.    BP 98/70 mmHg  Pulse 66  Temp(Src) 99 F (37.2 C) (Oral)  Resp 16  Ht 5\' 3"   (1.6 m)  Wt 150 lb 3.2 oz (68.13 kg)  BMI 26.61 kg/m2  SpO2 100%  LMP 12/07/2015       Objective:   Physical Exam  Constitutional: She is oriented to person, place, and time. She appears well-developed and well-nourished.  Cardiovascular: Normal rate, regular rhythm and normal heart sounds.   No murmur heard. Pulmonary/Chest: Effort normal and breath sounds normal. No respiratory distress. She has no wheezes.  Musculoskeletal:       Cervical back: She exhibits no tenderness.       Thoracic back: She exhibits no tenderness.       Lumbar back: She exhibits no tenderness.  Neurological: She is alert and oriented to person, place, and time. She exhibits normal muscle tone.  Psychiatric: She has a normal mood and affect. Her behavior is normal. Judgment and thought content normal.  GYN: deferred- pt prefers to see gyn Lymph- mobile marble sized non-tender ? Lymph noted noted left lower back. No cervical LAD is noted.        Assessment & Plan:  Back Pain- improving. She is interested in chiropractic which I think is reasonable.   Enlarged lymph node- discussed checking HSV2 titers (denies rashes).  She declines at this time.  Asked pt to follow back up in 1 month so I can recheck. If still enlarged plan further work up.

## 2015-12-09 ENCOUNTER — Telehealth: Payer: Self-pay | Admitting: *Deleted

## 2015-12-09 MED FILL — DEXTROAMP-AMP 10 MG TAB: 10 | 30 days supply | Qty: 60 | Fill #0

## 2015-12-09 NOTE — Telephone Encounter (Signed)
PA has been approved.  Cover My Meds key: [id number]

## 2015-12-18 DIAGNOSIS — M9902 Segmental and somatic dysfunction of thoracic region: Secondary | ICD-10-CM | POA: Diagnosis not present

## 2015-12-18 DIAGNOSIS — M9901 Segmental and somatic dysfunction of cervical region: Secondary | ICD-10-CM | POA: Diagnosis not present

## 2015-12-18 DIAGNOSIS — M9903 Segmental and somatic dysfunction of lumbar region: Secondary | ICD-10-CM | POA: Diagnosis not present

## 2015-12-18 DIAGNOSIS — M531 Cervicobrachial syndrome: Secondary | ICD-10-CM | POA: Diagnosis not present

## 2016-01-01 DIAGNOSIS — M9901 Segmental and somatic dysfunction of cervical region: Secondary | ICD-10-CM | POA: Diagnosis not present

## 2016-01-01 DIAGNOSIS — M531 Cervicobrachial syndrome: Secondary | ICD-10-CM | POA: Diagnosis not present

## 2016-01-01 DIAGNOSIS — M9903 Segmental and somatic dysfunction of lumbar region: Secondary | ICD-10-CM | POA: Diagnosis not present

## 2016-01-01 DIAGNOSIS — M9902 Segmental and somatic dysfunction of thoracic region: Secondary | ICD-10-CM | POA: Diagnosis not present

## 2016-01-08 DIAGNOSIS — M531 Cervicobrachial syndrome: Secondary | ICD-10-CM | POA: Diagnosis not present

## 2016-01-08 DIAGNOSIS — M9902 Segmental and somatic dysfunction of thoracic region: Secondary | ICD-10-CM | POA: Diagnosis not present

## 2016-01-08 DIAGNOSIS — M9901 Segmental and somatic dysfunction of cervical region: Secondary | ICD-10-CM | POA: Diagnosis not present

## 2016-01-08 DIAGNOSIS — M9903 Segmental and somatic dysfunction of lumbar region: Secondary | ICD-10-CM | POA: Diagnosis not present

## 2016-01-15 DIAGNOSIS — M9901 Segmental and somatic dysfunction of cervical region: Secondary | ICD-10-CM | POA: Diagnosis not present

## 2016-01-15 DIAGNOSIS — M9902 Segmental and somatic dysfunction of thoracic region: Secondary | ICD-10-CM | POA: Diagnosis not present

## 2016-01-15 DIAGNOSIS — M531 Cervicobrachial syndrome: Secondary | ICD-10-CM | POA: Diagnosis not present

## 2016-01-15 DIAGNOSIS — M9903 Segmental and somatic dysfunction of lumbar region: Secondary | ICD-10-CM | POA: Diagnosis not present

## 2016-01-20 ENCOUNTER — Other Ambulatory Visit: Payer: Self-pay | Admitting: Family

## 2016-01-20 DIAGNOSIS — S2239XA Fracture of one rib, unspecified side, initial encounter for closed fracture: Secondary | ICD-10-CM

## 2016-01-20 MED ORDER — AMPHETAMINE-DEXTROAMPHETAMINE 10 MG PO TABS: 10.0000 mg | ORAL_TABLET | Freq: Two times a day (BID) | ORAL | 0 refills | Status: DC

## 2016-01-20 NOTE — Telephone Encounter (Signed)
Pt last seen 12/07/15 and advised 1 month follow up of enlarged lymph node.  Last Rx: 12/07/15, #60 UDS: no previous, no csc on file.  Rx printed and forwarded to PCP for signature.  Will attach [facility] with rx and have pt complete it when she picks up Rx and provide UDS at that time as well.

## 2016-01-20 NOTE — Telephone Encounter (Signed)
Pt was last seen on 12/07/15. Last refill was 12/07/15 #60 with 0 refills.  Please advise on refill.

## 2016-01-21 ENCOUNTER — Encounter: Payer: Self-pay | Admitting: Family

## 2016-01-21 DIAGNOSIS — Z79899 Other long term (current) drug therapy: Secondary | ICD-10-CM | POA: Diagnosis not present

## 2016-01-21 MED FILL — DEXTROAMP-AMP 10 MG TAB: 10 | 30 days supply | Qty: 60 | Fill #0

## 2016-01-22 DIAGNOSIS — M9903 Segmental and somatic dysfunction of lumbar region: Secondary | ICD-10-CM | POA: Diagnosis not present

## 2016-01-22 DIAGNOSIS — M531 Cervicobrachial syndrome: Secondary | ICD-10-CM | POA: Diagnosis not present

## 2016-01-22 DIAGNOSIS — M9902 Segmental and somatic dysfunction of thoracic region: Secondary | ICD-10-CM | POA: Diagnosis not present

## 2016-01-22 DIAGNOSIS — M9901 Segmental and somatic dysfunction of cervical region: Secondary | ICD-10-CM | POA: Diagnosis not present

## 2016-01-29 DIAGNOSIS — M9903 Segmental and somatic dysfunction of lumbar region: Secondary | ICD-10-CM | POA: Diagnosis not present

## 2016-01-29 DIAGNOSIS — M9901 Segmental and somatic dysfunction of cervical region: Secondary | ICD-10-CM | POA: Diagnosis not present

## 2016-01-29 DIAGNOSIS — M531 Cervicobrachial syndrome: Secondary | ICD-10-CM | POA: Diagnosis not present

## 2016-01-29 DIAGNOSIS — M9902 Segmental and somatic dysfunction of thoracic region: Secondary | ICD-10-CM | POA: Diagnosis not present

## 2016-02-02 ENCOUNTER — Encounter: Payer: Self-pay | Admitting: Family

## 2016-02-10 DIAGNOSIS — M9903 Segmental and somatic dysfunction of lumbar region: Secondary | ICD-10-CM | POA: Diagnosis not present

## 2016-02-10 DIAGNOSIS — M531 Cervicobrachial syndrome: Secondary | ICD-10-CM | POA: Diagnosis not present

## 2016-02-10 DIAGNOSIS — M9901 Segmental and somatic dysfunction of cervical region: Secondary | ICD-10-CM | POA: Diagnosis not present

## 2016-02-10 DIAGNOSIS — M9902 Segmental and somatic dysfunction of thoracic region: Secondary | ICD-10-CM | POA: Diagnosis not present

## 2016-02-12 DIAGNOSIS — M9902 Segmental and somatic dysfunction of thoracic region: Secondary | ICD-10-CM | POA: Diagnosis not present

## 2016-02-12 DIAGNOSIS — M531 Cervicobrachial syndrome: Secondary | ICD-10-CM | POA: Diagnosis not present

## 2016-02-12 DIAGNOSIS — M9901 Segmental and somatic dysfunction of cervical region: Secondary | ICD-10-CM | POA: Diagnosis not present

## 2016-02-12 DIAGNOSIS — M9903 Segmental and somatic dysfunction of lumbar region: Secondary | ICD-10-CM | POA: Diagnosis not present

## 2016-02-19 DIAGNOSIS — M9902 Segmental and somatic dysfunction of thoracic region: Secondary | ICD-10-CM | POA: Diagnosis not present

## 2016-02-19 DIAGNOSIS — M9901 Segmental and somatic dysfunction of cervical region: Secondary | ICD-10-CM | POA: Diagnosis not present

## 2016-02-19 DIAGNOSIS — M9903 Segmental and somatic dysfunction of lumbar region: Secondary | ICD-10-CM | POA: Diagnosis not present

## 2016-02-19 DIAGNOSIS — M531 Cervicobrachial syndrome: Secondary | ICD-10-CM | POA: Diagnosis not present

## 2016-02-24 ENCOUNTER — Encounter: Payer: Self-pay | Admitting: Family

## 2016-02-24 ENCOUNTER — Other Ambulatory Visit: Payer: Self-pay | Admitting: Family

## 2016-02-24 MED ORDER — AMPHETAMINE-DEXTROAMPHETAMINE 10 MG PO TABS: 10.0000 mg | ORAL_TABLET | Freq: Two times a day (BID) | ORAL | 0 refills | Status: DC

## 2016-02-24 MED FILL — DEXTROAMP-AMPHETAMIN 10 MG: 10 | 30 days supply | Qty: 60 | Fill #0

## 2016-02-24 NOTE — Telephone Encounter (Signed)
Last adderall RX:  01/20/16 Last OV: 11/2015 Next OV: past due (1 month f/u) UDS: moderate, 01/2016  Rx printed and forwarded to PCP for signature.

## 2016-03-04 ENCOUNTER — Ambulatory Visit (INDEPENDENT_AMBULATORY_CARE_PROVIDER_SITE_OTHER): Payer: BLUE CROSS/BLUE SHIELD | Admitting: Family

## 2016-03-04 ENCOUNTER — Ambulatory Visit: Payer: BLUE CROSS/BLUE SHIELD | Admitting: Family

## 2016-03-04 ENCOUNTER — Encounter: Payer: Self-pay | Admitting: Family

## 2016-03-04 VITALS — BP 109/74 | HR 71 | Temp 98.6°F | Resp 16 | Ht 63.0 in | Wt 145.8 lb

## 2016-03-04 DIAGNOSIS — Z0001 Encounter for general adult medical examination with abnormal findings: Secondary | ICD-10-CM

## 2016-03-04 DIAGNOSIS — R229 Localized swelling, mass and lump, unspecified: Secondary | ICD-10-CM | POA: Diagnosis not present

## 2016-03-04 DIAGNOSIS — IMO0002 Reserved for concepts with insufficient information to code with codable children: Secondary | ICD-10-CM

## 2016-03-04 DIAGNOSIS — M9903 Segmental and somatic dysfunction of lumbar region: Secondary | ICD-10-CM | POA: Diagnosis not present

## 2016-03-04 DIAGNOSIS — M9901 Segmental and somatic dysfunction of cervical region: Secondary | ICD-10-CM | POA: Diagnosis not present

## 2016-03-04 DIAGNOSIS — M9902 Segmental and somatic dysfunction of thoracic region: Secondary | ICD-10-CM | POA: Diagnosis not present

## 2016-03-04 DIAGNOSIS — Z0000 Encounter for general adult medical examination without abnormal findings: Secondary | ICD-10-CM

## 2016-03-04 DIAGNOSIS — M531 Cervicobrachial syndrome: Secondary | ICD-10-CM | POA: Diagnosis not present

## 2016-03-04 LAB — CBC WITH DIFFERENTIAL/PLATELET
BASOS ABS: 0 10*3/uL (ref 0.0–0.1)
Basophils Relative: 0.5 % (ref 0.0–3.0)
EOS ABS: 0.2 10*3/uL (ref 0.0–0.7)
Eosinophils Relative: 2.6 % (ref 0.0–5.0)
HCT: 40 % (ref 36.0–46.0)
Hemoglobin: 13.3 g/dL (ref 12.0–15.0)
LYMPHS ABS: 3.4 10*3/uL (ref 0.7–4.0)
Lymphocytes Relative: 36.4 % (ref 12.0–46.0)
MCHC: 33.3 g/dL (ref 30.0–36.0)
MCV: 89.6 fl (ref 78.0–100.0)
MONOS PCT: 7.1 % (ref 3.0–12.0)
Monocytes Absolute: 0.7 10*3/uL (ref 0.1–1.0)
NEUTROS PCT: 53.4 % (ref 43.0–77.0)
Neutro Abs: 5 10*3/uL (ref 1.4–7.7)
Platelets: 282 10*3/uL (ref 150.0–400.0)
RBC: 4.47 Mil/uL (ref 3.87–5.11)
RDW: 13.4 % (ref 11.5–15.5)
WBC: 9.4 10*3/uL (ref 4.0–10.5)

## 2016-03-04 LAB — BASIC METABOLIC PANEL
BUN: 16 mg/dL (ref 6–23)
CALCIUM: 9.1 mg/dL (ref 8.4–10.5)
CO2: 29 mEq/L (ref 19–32)
Chloride: 104 mEq/L (ref 96–112)
Creatinine, Ser: 0.79 mg/dL (ref 0.40–1.20)
GFR: 89.72 mL/min (ref >60.00)
GLUCOSE: 82 mg/dL (ref 70–99)
Potassium: 4.1 mEq/L (ref 3.5–5.1)
SODIUM: 141 meq/L (ref 135–145)

## 2016-03-04 LAB — LIPID PANEL
CHOL/HDL RATIO: 3
Cholesterol: 171 mg/dL (ref 0–200)
HDL: 53 mg/dL (ref >39.00)
LDL Cholesterol: 106 mg/dL — ABNORMAL HIGH (ref 0–99)
NONHDL: 118.1
Triglycerides: 63 mg/dL (ref 0.0–149.0)
VLDL: 12.6 mg/dL (ref 0.0–40.0)

## 2016-03-04 LAB — HEPATIC FUNCTION PANEL
ALK PHOS: 57 U/L (ref 39–117)
ALT: 11 U/L (ref 0–35)
AST: 12 U/L (ref 0–37)
Albumin: 4.5 g/dL (ref 3.5–5.2)
BILIRUBIN DIRECT: 0.1 mg/dL (ref 0.0–0.3)
TOTAL PROTEIN: 7.6 g/dL (ref 6.0–8.3)
Total Bilirubin: 0.7 mg/dL (ref 0.2–1.2)

## 2016-03-04 LAB — URINALYSIS, ROUTINE W REFLEX MICROSCOPIC
Bilirubin Urine: NEGATIVE
Leukocytes, UA: NEGATIVE
Nitrite: NEGATIVE
SPECIFIC GRAVITY, URINE: 1.025 (ref 1.000–1.030)
Total Protein, Urine: NEGATIVE
Urine Glucose: NEGATIVE
Urobilinogen, UA: 0.2 (ref 0.0–1.0)
pH: 6 (ref 5.0–8.0)

## 2016-03-04 LAB — MAGNESIUM: MAGNESIUM: 2 mg/dL (ref 1.5–2.5)

## 2016-03-04 LAB — TSH: TSH: 0.52 u[IU]/mL (ref 0.35–4.50)

## 2016-03-04 NOTE — Patient Instructions (Signed)
Please complete lab work prior to leaving. Continue healthy diet, exercise. You will be contacted about scheduling the ultrasound.

## 2016-03-04 NOTE — Progress Notes (Signed)
Pre visit review using our clinic review tool, if applicable. No additional management support is needed unless otherwise documented below in the visit note. 

## 2016-03-04 NOTE — Progress Notes (Signed)
Subjective:    Patient ID: Kristin Sandoval, female    DOB: January 15, 1986, 30 y.o.   MRN: [id number]  HPI  Patient presents today for complete physical.  Immunizations: declines flu shot, tetanus is scheduled.  She continues to work on healthy diet and exercise Colonoscopy: scheduled. Pap: up to date.  Reports she saw ENT for perforated TM's and was told nothing to do if not causing her problems   Annual Exam(Pt here for fasting physical. Last tdap 02/2015, declines flu vaccine, pap smear 02/2015 and has appt with GYN on 03/16/16 and wants to have them do her breast exam. Reports that she still has the swollen lymph node. Would also like to have her calcium and magnesium levels checked today.)  Review of Systems  Constitutional: Negative for unexpected weight change.  HENT: Positive for rhinorrhea. Negative for hearing loss.        Mild bilateral ear pain- has been off/on  Eyes: Negative for visual disturbance.  Respiratory: Negative for cough.   Cardiovascular: Negative for leg swelling.  Gastrointestinal: Negative for constipation and diarrhea.  Genitourinary: Negative for dysuria and frequency.  Musculoskeletal:       Some aching in her fingers.    Skin:       Acne, will schedule a derm follow up with Dr. Jarome Matin  Neurological:       Occasional HA's which she attributes to neck pain (seeing chiropractor which helps)  Hematological:       ? Lymph node left lower back  Psychiatric/Behavioral:       Depression/anxiety improved. ADHD- continues adderall   Past Medical History:  Diagnosis Date  . Asthma    childhood     Social History   Social History  . Marital status: Single    Spouse name: N/A  . Number of children: 0  . Years of education: N/A   Occupational History  .  [employer]   Social History Main Topics  . Smoking status: Former Smoker    Types: Cigarettes    Quit date: 06/30/2006  . Smokeless tobacco: Never Used  . Alcohol use Yes     Comment: 1-3  drinks weekly  . Drug use: No  . Sexual activity: Not on file   Other Topics Concern  . Not on file   Social History Narrative   Caffeine Use:  1 cup coffee daily   Regular exercise:  4 x weekly          Past Surgical History:  Procedure Laterality Date  . ADENOIDECTOMY     30 yrs old  . BREAST ENHANCEMENT SURGERY Bilateral 2014  . TONSILLECTOMY  4/15   due to tonsil stones    Family History  Problem Relation Age of Onset  . Alcohol abuse Father   . Arthritis Maternal Uncle   . Hyperlipidemia Maternal Uncle   . Alcohol abuse Paternal Aunt   . Arthritis Maternal Grandmother     DDD, spinal stenosis  . Hyperlipidemia Maternal Grandmother   . Alcohol abuse Maternal Grandfather   . Alcohol abuse Paternal Grandfather     Allergies  Allergen Reactions  . Apple Shortness Of Breath  . Acrylic Polymer [Carbomer]     Loses nail  . Other Other (See Comments)    STRAW / HAY.  SEVERE  ITCHING, CONGESTION.  . Adhesive [Tape] Rash  . Mango Flavor Rash    Current Outpatient Prescriptions on File Prior to Visit  Medication Sig Dispense Refill  . amphetamine-dextroamphetamine (  ADDERALL) 10 MG tablet Take 1 tablet (10 mg total) by mouth 2 (two) times daily with a meal. 60 tablet 0  . ibuprofen (ADVIL,MOTRIN) 600 MG tablet 1 tab po q8h prn at onset of headache 30 tablet 3  . JUNEL FE 1/20 1-20 MG-MCG tablet Take 1 tablet by mouth daily. 3 Package 1  . PROAIR HFA 108 (90 Base) MCG/ACT inhaler INHALE 2 PUFFS INTO THE LUNGS EVERY 6 (SIX) HOURS AS NEEDED FOR WHEEZING OR SHORTNESS OF BREATH. 8 Inhaler 0  . tretinoin (RETIN-A) 0.05 % cream Apply topically at bedtime. 45 g 0  . valACYclovir (VALTREX) 500 MG tablet TAKE 1 TABLET (500 MG TOTAL) BY MOUTH DAILY AS NEEDED. 30 tablet 11   No current facility-administered medications on file prior to visit.     BP 109/74 (BP Location: Right Arm, Cuff Size: Normal)   Pulse 71   Temp 98.6 F (37 C) (Oral)   Resp 16   Ht 5\' 3"  (1.6 m)   Wt  145 lb 12.8 oz (66.1 kg)   LMP 03/01/2016   SpO2 100% Comment: room air  BMI 25.83 kg/m       Objective:   Physical Exam  Physical Exam  Constitutional: She is oriented to person, place, and time. She appears well-developed and well-nourished. No distress.  HENT:  Head: Normocephalic and atraumatic.  Right Ear: Tympanic membrane + perforation noted and ear canal normal.  Left Ear: Tympanic membrane + perforation noted and ear canal normal.  Mouth/Throat: Oropharynx is clear and moist.  Eyes: Pupils are equal, round, and reactive to light. No scleral icterus.  Neck: Normal range of motion. No thyromegaly present.  Cardiovascular: Normal rate and regular rhythm.   No murmur heard. Pulmonary/Chest: Effort normal and breath sounds normal. No respiratory distress. He has no wheezes. She has no rales. She exhibits no tenderness.  Abdominal: Soft. Bowel sounds are normal. She exhibits no distension and no mass. There is no tenderness. There is no rebound and no guarding.  Musculoskeletal: She exhibits no edema. soft, mobile mass left lower back approx 1 inch left of spine Lymphadenopathy:    She has no cervical adenopathy.  Neurological: She is alert and oriented to person, place, and time. She has normal patellar reflexes. She exhibits normal muscle tone. Coordination normal.  Skin: Skin is warm and dry.  Psychiatric: She has a normal mood and affect. Her behavior is normal. Judgment and thought content normal.  Breast/pelvic: deferred        Assessment & Plan:   Mass- suspect lipoma- will obtain US to further evaluate.       Assessment & Plan:

## 2016-03-04 NOTE — Assessment & Plan Note (Signed)
Continue healthy diet and exercise.  Obtain routine labs. She is also requesting magnesium.

## 2016-03-07 ENCOUNTER — Telehealth: Payer: Self-pay | Admitting: *Deleted

## 2016-03-07 DIAGNOSIS — R229 Localized swelling, mass and lump, unspecified: Principal | ICD-10-CM

## 2016-03-07 DIAGNOSIS — IMO0002 Reserved for concepts with insufficient information to code with codable children: Secondary | ICD-10-CM

## 2016-03-07 NOTE — Telephone Encounter (Signed)
Received call from [facility] in radiology stating they are not allowed to use the order for ultrasound soft tissue any longer. If area to be viewed is below the umbilicus (whether front or back) to use pelvic limited. If area is above the umbilicus (whether front or back) then used abdomen limited. She is asking [place] to change this pt's order.  Please advise.

## 2016-03-07 NOTE — Addendum Note (Signed)
Addended by: Debbrah Alar on: 03/07/2016 08:11 AM   Modules accepted: Orders

## 2016-03-08 ENCOUNTER — Ambulatory Visit (HOSPITAL_BASED_OUTPATIENT_CLINIC_OR_DEPARTMENT_OTHER)
Admission: RE | Admit: 2016-03-08 | Discharge: 2016-03-08 | Disposition: A | Discharge status: Home / Self Care | Payer: BLUE CROSS/BLUE SHIELD | Source: Ambulatory Visit | Attending: Family | Admitting: Family

## 2016-03-08 DIAGNOSIS — IMO0002 Reserved for concepts with insufficient information to code with codable children: Secondary | ICD-10-CM

## 2016-03-08 DIAGNOSIS — R2242 Localized swelling, mass and lump, left lower limb: Secondary | ICD-10-CM | POA: Diagnosis not present

## 2016-03-08 DIAGNOSIS — R229 Localized swelling, mass and lump, unspecified: Secondary | ICD-10-CM | POA: Insufficient documentation

## 2016-03-09 ENCOUNTER — Encounter: Payer: Self-pay | Admitting: Family

## 2016-03-11 DIAGNOSIS — M531 Cervicobrachial syndrome: Secondary | ICD-10-CM | POA: Diagnosis not present

## 2016-03-11 DIAGNOSIS — M9901 Segmental and somatic dysfunction of cervical region: Secondary | ICD-10-CM | POA: Diagnosis not present

## 2016-03-11 DIAGNOSIS — M9902 Segmental and somatic dysfunction of thoracic region: Secondary | ICD-10-CM | POA: Diagnosis not present

## 2016-03-11 DIAGNOSIS — M9903 Segmental and somatic dysfunction of lumbar region: Secondary | ICD-10-CM | POA: Diagnosis not present

## 2016-03-16 ENCOUNTER — Encounter (HOSPITAL_COMMUNITY): Payer: Self-pay | Admitting: *Deleted

## 2016-03-16 ENCOUNTER — Ambulatory Visit: Payer: BLUE CROSS/BLUE SHIELD | Admitting: Family

## 2016-03-16 ENCOUNTER — Other Ambulatory Visit: Payer: Self-pay | Admitting: Obstetrics and Gynecology

## 2016-03-16 DIAGNOSIS — Z6825 Body mass index (BMI) 25.0-25.9, adult: Secondary | ICD-10-CM | POA: Diagnosis not present

## 2016-03-16 DIAGNOSIS — Z113 Encounter for screening for infections with a predominantly sexual mode of transmission: Secondary | ICD-10-CM | POA: Diagnosis not present

## 2016-03-16 DIAGNOSIS — D179 Benign lipomatous neoplasm, unspecified: Secondary | ICD-10-CM | POA: Insufficient documentation

## 2016-03-16 DIAGNOSIS — G8929 Other chronic pain: Secondary | ICD-10-CM | POA: Insufficient documentation

## 2016-03-16 DIAGNOSIS — M542 Cervicalgia: Secondary | ICD-10-CM | POA: Insufficient documentation

## 2016-03-16 DIAGNOSIS — Z01419 Encounter for gynecological examination (general) (routine) without abnormal findings: Secondary | ICD-10-CM | POA: Diagnosis not present

## 2016-03-18 DIAGNOSIS — M9901 Segmental and somatic dysfunction of cervical region: Secondary | ICD-10-CM | POA: Diagnosis not present

## 2016-03-18 DIAGNOSIS — M531 Cervicobrachial syndrome: Secondary | ICD-10-CM | POA: Diagnosis not present

## 2016-03-18 DIAGNOSIS — M9902 Segmental and somatic dysfunction of thoracic region: Secondary | ICD-10-CM | POA: Diagnosis not present

## 2016-03-18 DIAGNOSIS — M9903 Segmental and somatic dysfunction of lumbar region: Secondary | ICD-10-CM | POA: Diagnosis not present

## 2016-03-21 ENCOUNTER — Encounter (HOSPITAL_COMMUNITY): Payer: Self-pay | Admitting: Registered Nurse

## 2016-03-21 ENCOUNTER — Ambulatory Visit (HOSPITAL_COMMUNITY)
Admission: RE | Admit: 2016-03-21 | Discharge: 2016-03-21 | Disposition: A | Discharge status: Home / Self Care | Payer: BLUE CROSS/BLUE SHIELD | Source: Ambulatory Visit | Attending: General Surgery | Admitting: General Surgery

## 2016-03-21 ENCOUNTER — Encounter (HOSPITAL_COMMUNITY)
Admission: RE | Disposition: A | Discharge status: Home / Self Care | Payer: Self-pay | Source: Ambulatory Visit | Attending: General Surgery

## 2016-03-21 ENCOUNTER — Ambulatory Visit (HOSPITAL_COMMUNITY): Payer: BLUE CROSS/BLUE SHIELD | Admitting: Anesthesiology

## 2016-03-21 DIAGNOSIS — J45909 Unspecified asthma, uncomplicated: Secondary | ICD-10-CM | POA: Diagnosis not present

## 2016-03-21 DIAGNOSIS — Z85820 Personal history of malignant melanoma of skin: Secondary | ICD-10-CM | POA: Insufficient documentation

## 2016-03-21 DIAGNOSIS — K598 Other specified functional intestinal disorders: Secondary | ICD-10-CM | POA: Diagnosis not present

## 2016-03-21 DIAGNOSIS — K5900 Constipation, unspecified: Secondary | ICD-10-CM | POA: Insufficient documentation

## 2016-03-21 DIAGNOSIS — K625 Hemorrhage of anus and rectum: Secondary | ICD-10-CM | POA: Diagnosis not present

## 2016-03-21 DIAGNOSIS — K644 Residual hemorrhoidal skin tags: Secondary | ICD-10-CM | POA: Insufficient documentation

## 2016-03-21 DIAGNOSIS — K5289 Other specified noninfective gastroenteritis and colitis: Secondary | ICD-10-CM | POA: Diagnosis not present

## 2016-03-21 DIAGNOSIS — F329 Major depressive disorder, single episode, unspecified: Secondary | ICD-10-CM | POA: Diagnosis not present

## 2016-03-21 DIAGNOSIS — Z87891 Personal history of nicotine dependence: Secondary | ICD-10-CM | POA: Insufficient documentation

## 2016-03-21 DIAGNOSIS — K922 Gastrointestinal hemorrhage, unspecified: Secondary | ICD-10-CM | POA: Diagnosis not present

## 2016-03-21 HISTORY — DX: Headache: R51

## 2016-03-21 HISTORY — DX: Headache, unspecified: R51.9

## 2016-03-21 HISTORY — DX: Other complications of anesthesia, initial encounter: T88.59XA

## 2016-03-21 HISTORY — PX: COLONOSCOPY WITH PROPOFOL: SHX5780

## 2016-03-21 HISTORY — DX: Adverse effect of unspecified anesthetic, initial encounter: T41.45XA

## 2016-03-21 HISTORY — DX: Attention-deficit hyperactivity disorder, unspecified type: F90.9

## 2016-03-21 SURGERY — COLONOSCOPY WITH PROPOFOL
Anesthesia: Monitor Anesthesia Care

## 2016-03-21 MED ORDER — ONDANSETRON HCL 4 MG/2ML IJ SOLN
INTRAMUSCULAR | Status: AC
  Filled 2016-03-21: qty 2

## 2016-03-21 MED ORDER — ONDANSETRON HCL 4 MG/2ML IJ SOLN
INTRAMUSCULAR | Status: DC | PRN
  Administered 2016-03-21: 4 mg via INTRAVENOUS

## 2016-03-21 MED ORDER — PHENYLEPHRINE 40 MCG/ML (10ML) SYRINGE FOR IV PUSH (FOR BLOOD PRESSURE SUPPORT)
PREFILLED_SYRINGE | INTRAVENOUS | Status: AC
  Filled 2016-03-21: qty 10

## 2016-03-21 MED ORDER — PROPOFOL 10 MG/ML IV BOLUS
INTRAVENOUS | Status: AC
  Filled 2016-03-21: qty 20

## 2016-03-21 MED ORDER — PROPOFOL 10 MG/ML IV BOLUS
INTRAVENOUS | Status: AC
  Filled 2016-03-21: qty 40

## 2016-03-21 MED ORDER — LIDOCAINE 2% (20 MG/ML) 5 ML SYRINGE
INTRAMUSCULAR | Status: DC | PRN
  Administered 2016-03-21: 100 mg via INTRAVENOUS

## 2016-03-21 MED ORDER — LACTATED RINGERS IV SOLN
INTRAVENOUS | Status: DC
  Administered 2016-03-21: 07:00:00 via INTRAVENOUS

## 2016-03-21 MED ORDER — PROPOFOL 500 MG/50ML IV EMUL
INTRAVENOUS | Status: DC | PRN
  Administered 2016-03-21: 120 ug/kg/min via INTRAVENOUS

## 2016-03-21 MED ORDER — PHENYLEPHRINE 40 MCG/ML (10ML) SYRINGE FOR IV PUSH (FOR BLOOD PRESSURE SUPPORT)
PREFILLED_SYRINGE | INTRAVENOUS | Status: DC | PRN
  Administered 2016-03-21: 80 ug via INTRAVENOUS

## 2016-03-21 MED ORDER — LIDOCAINE 2% (20 MG/ML) 5 ML SYRINGE
INTRAMUSCULAR | Status: AC
  Filled 2016-03-21: qty 5

## 2016-03-21 MED ORDER — PROPOFOL 10 MG/ML IV BOLUS
INTRAVENOUS | Status: DC | PRN
  Administered 2016-03-21: 40 mg via INTRAVENOUS
  Administered 2016-03-21: 20 mg via INTRAVENOUS
  Administered 2016-03-21: 30 mg via INTRAVENOUS
  Administered 2016-03-21: 20 mg via INTRAVENOUS

## 2016-03-21 NOTE — H&P (Signed)
The patient is a 30 year old female who presents with hemorrhoids. 30 year old female with a history of constipation who presents to the office with bleeding after bowel movements. This is been going on for several years. Some days are worse than others. Most the time the bleeding is painless. She has an episode of constipation approximately once a month which requires straining. She then notes several days of pain afterwards. She has tried Proctofoam for approximately 3 months. She used it on a daily basis for several weeks and then switched to when necessary. She has noticed no real changes in her symptoms.   Other Problems  Asthma Melanoma  Past Surgical History  Breast Augmentation Bilateral. Tonsillectomy  Diagnostic Studies History  Colonoscopy never Mammogram never Pap Smear 1-5 years ago  Allergies  Acryline 2 *MEDICAL DEVICES AND SUPPLIES* Adhesive 1"x6yd *MEDICAL DEVICES AND SUPPLIES* Mango Flavor *PHARMACEUTICAL ADJUVANTS* Apple Cider Vinegar *ALTERNATIVE MEDICINES*  Medication History  Amphetamine-Dextroamphetamine (10MG  Tablet, Oral) Active. Junel FE 1/20 (1-20MG -MCG Tablet, Oral) Active. Valtrex (500MG  Tablet, Oral) Active. Adderall (10MG  Tablet, Oral) Active. Albuterol (90MCG/ACT Aerosol Soln, Inhalation) Active. Medications Reconciled  Social History  Alcohol use Occasional alcohol use. Caffeine use Coffee, Tea. No drug use Tobacco use Former smoker.  Family History  Alcohol Abuse Father.  Pregnancy / Birth History  Age at menarche 31 years. Contraceptive History Contraceptive implant, Oral contraceptives. Gravida 0 Irregular periods Para 0    Review of Systems General Not Present- Appetite Loss, Chills, Fatigue, Fever, Night Sweats, Weight Gain and Weight Loss. Skin Not Present- Change in Wart/Mole, Dryness, Hives, Jaundice, New Lesions, Non-Healing Wounds, Rash and Ulcer. HEENT Not Present- Earache, Hearing  Loss, Hoarseness, Nose Bleed, Oral Ulcers, Ringing in the Ears, Seasonal Allergies, Sinus Pain, Sore Throat, Visual Disturbances, Wears glasses/contact lenses and Yellow Eyes. Respiratory Not Present- Bloody sputum, Chronic Cough, Difficulty Breathing, Snoring and Wheezing. Breast Not Present- Breast Mass, Breast Pain, Nipple Discharge and Skin Changes. Cardiovascular Not Present- Chest Pain, Difficulty Breathing Lying Down, Leg Cramps, Palpitations, Rapid Heart Rate, Shortness of Breath and Swelling of Extremities. Gastrointestinal Present- Hemorrhoids. Not Present- Abdominal Pain, Bloating, Bloody Stool, Change in Bowel Habits, Chronic diarrhea, Constipation, Difficulty Swallowing, Excessive gas, Gets full quickly at meals, Indigestion, Nausea, Rectal Pain and Vomiting. Female Genitourinary Not Present- Frequency, Nocturia, Painful Urination, Pelvic Pain and Urgency. Musculoskeletal Not Present- Back Pain, Joint Pain, Joint Stiffness, Muscle Pain, Muscle Weakness and Swelling of Extremities. Neurological Not Present- Decreased Memory, Fainting, Headaches, Numbness, Seizures, Tingling, Tremor, Trouble walking and Weakness. Psychiatric Not Present- Anxiety, Bipolar, Change in Sleep Pattern, Depression, Fearful and Frequent crying. Endocrine Not Present- Cold Intolerance, Excessive Hunger, Hair Changes, Heat Intolerance, Hot flashes and New Diabetes. Hematology Not Present- Easy Bruising, Excessive bleeding, Gland problems, HIV and Persistent Infections.  Ht 5\' 3"  (1.6 m)   Wt 63.5 kg (140 lb)   LMP 03/01/2016   BMI 24.80 kg/m    Physical Exam  General Mental Status-Alert. General Appearance-Not in acute distress. Build & Nutrition-Well nourished. Posture-Normal posture. Gait-Normal.  Head and Neck Head-normocephalic, atraumatic with no lesions or palpable masses. Trachea-midline.  Chest and Lung Exam Chest and lung exam reveals -on auscultation, normal breath sounds, no  adventitious sounds and normal vocal resonance.  Cardiovascular Cardiovascular examination reveals -normal heart sounds, regular rate and rhythm with no murmurs and femoral artery auscultation bilaterally reveals normal pulses, no bruits, no thrills.  Abdomen Inspection Inspection of the abdomen reveals - No Hernias. Palpation/Percussion Palpation and Percussion of the abdomen reveal -  Soft, Non Tender, No Rigidity (guarding) and No Palpable abdominal masses.  Neurologic Neurologic evaluation reveals -alert and oriented x 3 with no impairment of recent or remote memory, normal attention span and ability to concentrate, normal sensation and normal coordination.  Musculoskeletal Normal Exam - Bilateral-Upper Extremity Strength Normal and Lower Extremity Strength Normal.   ANOSCOPY, DIAGNOSTIC [id number]) [ Hemorrhoids ] Procedure Other: Procedure: Anoscopy Surgeon: Marcello Moores After the risks and benefits were explained, verbal consent was obtained for above procedure. A medical assistant chaperone was present thoroughout the entire procedure. Anesthesia: none Diagnosis: Rectal bleeding Findings: No obvious internal hemorrhoids. Small skin tags noted externally. No obvious source of hemorrhage noted.    Assessment & Plan  RECTAL BLEEDING (K62.5) Impression: 30 year old female with rectal bleeding. Anoscopy does not show an obvious source of her bleeding. I have recommended a colonoscopy to further evaluate this. Risks were explained to the patient.  These include bleeding, missed pathology and perforation.  I believe she understands this and has agreed to proceed.

## 2016-03-21 NOTE — Transfer of Care (Signed)
Immediate Anesthesia Transfer of Care Note  Patient: Kristin Sandoval  Procedure(s) Performed: Procedure(s): COLONOSCOPY WITH PROPOFOL (N/A)  Patient Location: PACU and Endoscopy Unit  Anesthesia Type:MAC  Level of Consciousness: awake, alert , oriented and patient cooperative  Airway & Oxygen Therapy: Patient Spontanous Breathing and Patient connected to face mask oxygen  Post-op Assessment: Report given to RN, Post -op Vital signs reviewed and stable and Patient moving all extremities  Post vital signs: Reviewed and stable  Last Vitals: There were no vitals filed for this visit.  Last Pain: There were no vitals filed for this visit.       Complications: No apparent anesthesia complications

## 2016-03-21 NOTE — Op Note (Signed)
[facility] Patient Name: Kristin Sandoval Procedure Date: 03/21/2016 MRN: [id number] Attending MD: Leighton Ruff , MD Date of Birth: 1985-10-26 CSN: [id number] Age: 30 Admit Type: Outpatient Procedure:                Colonoscopy Indications:              Rectal bleeding Providers:                Leighton Ruff, MD, Laverta Baltimore RN, RN,                            William Dalton, Technician Referring MD:              Medicines:                Propofol per Anesthesia Complications:            No immediate complications. Estimated Blood Loss:     Estimated blood loss was minimal. Procedure:                Pre-Anesthesia Assessment:                           - Prior to the procedure, a History and Physical                            was performed, and patient medications and                            allergies were reviewed. The patient's tolerance of                            previous anesthesia was also reviewed. The risks                            and benefits of the procedure and the sedation                            options and risks were discussed with the patient.                            All questions were answered, and informed consent                            was obtained. Prior Anticoagulants: The patient has                            taken no previous anticoagulant or antiplatelet                            agents. ASA Grade Assessment: I - A normal, healthy                            patient. After reviewing the risks and benefits,                            the  patient was deemed in satisfactory condition to                            undergo the procedure.                           - Sedation was administered by an anesthesia                            professional. The sedation level attained was                            moderate.                           After obtaining informed consent, the colonoscope                            was passed  under direct vision. Throughout the                            procedure, the patient's blood pressure, pulse, and                            oxygen saturations were monitored continuously. The                            [id number] scope was introduced through                            the anus and advanced to the the cecum, identified                            by appendiceal orifice and ileocecal valve. The                            patient tolerated the procedure well. The ileocecal                            valve, the appendiceal orifice and the rectum were                            photographed. The entire colon was well visualized.                            The quality of the bowel preparation was excellent. Scope In: 7:49:47 AM Scope Out: 8:09:50 AM Total Procedure Duration: 0 hours 20 minutes 3 seconds  Findings:      The digital rectal exam was normal. Pertinent negatives include normal       sphincter tone.      The perianal exam findings include skin tags.      Localized mild inflammation characterized by erosions was found in the       proximal ascending colon. Biopsies were taken with a cold forceps for       histology. Estimated  blood loss was minimal.      The exam was otherwise without abnormality on direct and retroflexion       views. Impression:               - Perianal skin tags found on perianal exam.                           - Localized mild inflammation was found in the                            proximal ascending colon, rule out inflammatory                            bowel disease. Biopsied.                           - The examination was otherwise normal on direct                            and retroflexion views. Moderate Sedation:      Moderate (conscious) sedation was personally administered by an       anesthesia professional. The following parameters were monitored: oxygen       saturation, heart rate, blood pressure, and response to  care. Recommendation:           - Discharge patient to home (ambulatory).                           - High fiber diet for the rest of the patient's                            life.                           - Continue present medications.                           - Await pathology results.                           - Repeat colonoscopy at age of 89 for screening                            purposes. Procedure Code(s):        --- Professional ---                           [phone], Colonoscopy, flexible; with biopsy, single                            or multiple Diagnosis Code(s):        --- Professional ---                           K52.9, Noninfective gastroenteritis and colitis,  unspecified                           K64.4, Residual hemorrhoidal skin tags                           K62.5, Hemorrhage of anus and rectum CPT copyright 2016 American Medical Association. All rights reserved. The codes documented in this report are preliminary and upon coder review may  be revised to meet current compliance requirements. Leighton Ruff, MD Leighton Ruff, MD [id number] 8:20:01 AM This report has been signed electronically. Number of Addenda: 0

## 2016-03-21 NOTE — Discharge Instructions (Signed)
Post Colonoscopy Instructions ° °1. DIET: Follow a light bland diet the first 24 hours after arrival home, such as soup, liquids, crackers, etc.  Be sure to include lots of fluids daily.  Avoid fast food or heavy meals as your are more likely to get nauseated.   °2. You may have some mild rectal bleeding for the first few days after the procedure.  This should get less and less with time.  Resume any blood thinners 2 days after your procedure unless directed otherwise by your physician. °3. Take your usually prescribed home medications unless otherwise directed. °a. If you have any pain, it is helpful to get up and walk around, as it is usually from excess gas. °b. If this is not helpful, you can take an over-the-counter pain medication.  Choose one of the following that works best for you: °i. Naproxen (Aleve, etc)  Two 220mg tabs twice a day °ii. Ibuprofen (Advil, etc) Three 200mg tabs four times a day (every meal & bedtime) °iii. If you still have pain after using one of these, please call the office °4. It is normal to not have a bowel movement for 2-3 days after colonoscopy.   ° °5. ACTIVITIES as tolerated:   °6. You may resume regular (light) daily activities beginning the next day--such as daily self-care, walking, climbing stairs--gradually increasing activities as tolerated.  ° ° °WHEN TO CALL US [phone]: °1. Fever over 101.5 F (38.5 C)  °2. Severe abdominal or chest pain  °3. Large amount of rectal bleeding, passing multiple blood clots  °4. Dizziness or shortness of breath °5. Increasing nausea or vomiting ° ° The clinic staff is available to answer your questions during regular business hours (8:30am-5pm).  Please don’t hesitate to call and ask to speak to one of our nurses for clinical concerns.  ° If you have a medical emergency, go to the nearest emergency room or call 911. ° A surgeon from [facility] is always on call at the hospitals ° ° °[facility] °[address], [address], [place], [place]  [address] ? °MAIN: [phone] ? TOLL FREE: [phone] ?  °FAX [phone] °[email or web address] ° ° °

## 2016-03-21 NOTE — Anesthesia Postprocedure Evaluation (Signed)
Anesthesia Post Note  Patient: Kristin Sandoval  Procedure(s) Performed: Procedure(s) (LRB): COLONOSCOPY WITH PROPOFOL (N/A)  Patient location during evaluation: PACU Anesthesia Type: MAC Level of consciousness: awake and alert Pain management: pain level controlled Vital Signs Assessment: post-procedure vital signs reviewed and stable Respiratory status: spontaneous breathing, nonlabored ventilation, respiratory function stable and patient connected to nasal cannula oxygen Cardiovascular status: stable and blood pressure returned to baseline Anesthetic complications: no    Last Vitals:  Vitals:   03/21/16 0825 03/21/16 0835  BP: (!) 115/55 (!) 95/57  Pulse: 77 66  Resp: 19 15  Temp:      Last Pain:  Vitals:   03/21/16 0815  TempSrc: Oral                 Hogan Hoobler J

## 2016-03-21 NOTE — Anesthesia Preprocedure Evaluation (Addendum)
Anesthesia Evaluation  Patient identified by MRN, date of birth, ID band Patient awake    Reviewed: Allergy & Precautions, NPO status , Patient's Chart, lab work & pertinent test results  History of Anesthesia Complications (+) history of anesthetic complications  Airway Mallampati: II  TM Distance: >3 FB Neck ROM: Full    Dental no notable dental hx.    Pulmonary asthma , former smoker,    Pulmonary exam normal breath sounds clear to auscultation       Cardiovascular negative cardio ROS Normal cardiovascular exam Rhythm:Regular Rate:Normal     Neuro/Psych  Headaches, PSYCHIATRIC DISORDERS Depression    GI/Hepatic negative GI ROS, Neg liver ROS,   Endo/Other  negative endocrine ROS  Renal/GU negative Renal ROS  negative genitourinary   Musculoskeletal negative musculoskeletal ROS (+)   Abdominal   Peds negative pediatric ROS (+)  Hematology negative hematology ROS (+)   Anesthesia Other Findings   Reproductive/Obstetrics negative OB ROS LMP 03-01-16                            Anesthesia Physical Anesthesia Plan  ASA: II  Anesthesia Plan: MAC   Post-op Pain Management:    Induction: Intravenous  Airway Management Planned: Natural Airway  Additional Equipment:   Intra-op Plan:   Post-operative Plan:   Informed Consent: I have reviewed the patients History and Physical, chart, labs and discussed the procedure including the risks, benefits and alternatives for the proposed anesthesia with the patient or authorized representative who has indicated his/her understanding and acceptance.   Dental advisory given  Plan Discussed with: CRNA  Anesthesia Plan Comments:         Anesthesia Quick Evaluation

## 2016-03-22 ENCOUNTER — Encounter (HOSPITAL_COMMUNITY): Payer: Self-pay | Admitting: General Surgery

## 2016-03-25 DIAGNOSIS — M9901 Segmental and somatic dysfunction of cervical region: Secondary | ICD-10-CM | POA: Diagnosis not present

## 2016-03-25 DIAGNOSIS — M9902 Segmental and somatic dysfunction of thoracic region: Secondary | ICD-10-CM | POA: Diagnosis not present

## 2016-03-25 DIAGNOSIS — M531 Cervicobrachial syndrome: Secondary | ICD-10-CM | POA: Diagnosis not present

## 2016-03-25 DIAGNOSIS — M9903 Segmental and somatic dysfunction of lumbar region: Secondary | ICD-10-CM | POA: Diagnosis not present

## 2016-04-13 ENCOUNTER — Other Ambulatory Visit: Payer: Self-pay | Admitting: Family

## 2016-04-13 ENCOUNTER — Encounter: Payer: Self-pay | Admitting: Family

## 2016-04-13 MED ORDER — AMPHETAMINE-DEXTROAMPHETAMINE 10 MG PO TABS: 10.0000 mg | ORAL_TABLET | Freq: Two times a day (BID) | ORAL | 0 refills | Status: DC

## 2016-04-13 NOTE — Telephone Encounter (Signed)
Kristin Sandoval,  Form is in your red folder for completion and signature.

## 2016-04-13 NOTE — Telephone Encounter (Signed)
Last Adderall Rx 02/24/16, #60. Last OV 03/04/16. UDS due 04/22/16.  Rx printed and forwarded to provider for signature.

## 2016-04-15 DIAGNOSIS — M531 Cervicobrachial syndrome: Secondary | ICD-10-CM | POA: Diagnosis not present

## 2016-04-15 DIAGNOSIS — M9901 Segmental and somatic dysfunction of cervical region: Secondary | ICD-10-CM | POA: Diagnosis not present

## 2016-04-15 DIAGNOSIS — M9903 Segmental and somatic dysfunction of lumbar region: Secondary | ICD-10-CM | POA: Diagnosis not present

## 2016-04-15 DIAGNOSIS — M9902 Segmental and somatic dysfunction of thoracic region: Secondary | ICD-10-CM | POA: Diagnosis not present

## 2016-04-18 DIAGNOSIS — K644 Residual hemorrhoidal skin tags: Secondary | ICD-10-CM | POA: Diagnosis not present

## 2016-04-28 DIAGNOSIS — Z79899 Other long term (current) drug therapy: Secondary | ICD-10-CM | POA: Diagnosis not present

## 2016-04-29 MED FILL — DEXTROAMP-AMPHETAMIN 10 MG: 10 | 30 days supply | Qty: 60 | Fill #0

## 2016-05-02 ENCOUNTER — Telehealth: Payer: Self-pay | Admitting: *Deleted

## 2016-05-02 DIAGNOSIS — M9901 Segmental and somatic dysfunction of cervical region: Secondary | ICD-10-CM | POA: Diagnosis not present

## 2016-05-02 DIAGNOSIS — M9903 Segmental and somatic dysfunction of lumbar region: Secondary | ICD-10-CM | POA: Diagnosis not present

## 2016-05-02 DIAGNOSIS — M531 Cervicobrachial syndrome: Secondary | ICD-10-CM | POA: Diagnosis not present

## 2016-05-02 DIAGNOSIS — M9902 Segmental and somatic dysfunction of thoracic region: Secondary | ICD-10-CM | POA: Diagnosis not present

## 2016-05-02 NOTE — Telephone Encounter (Signed)
Patient came in to office on Friday, 04/29/16 to p/u her Adderall Rx which had a note from PCP's assistant that pt was due for UDS. Patient became upset and was refusing to comply with front staff [Gwen]; I was asked to speak with the patient. Upon approaching patient, I could see she was irked about the situation. I introduced myself, in the absence of patient's PCP and Medical Assistant and began to explain to her that her PCP had requested that she give a UDS before releasing her Rx and that we simply must follow her instructions. Informed patient that I would be glad to send her inquiry over through a note to her provider, as to why she is being required to give the UDS [as she felt the one given in August '17 should be more than sufficient]. Patient stated that she was also going to send a message to her provider. Patient took what I would consider longer than normal to use restroom, and upon pick-up urine sample [after patient lefdt the office-small amount], it did not have body temperature in cup, nor did it register properly on specimen cup, I went to [place] for review and she concurred/SLS 11/13

## 2016-05-03 NOTE — Telephone Encounter (Signed)
Noted  

## 2016-05-16 DIAGNOSIS — M9903 Segmental and somatic dysfunction of lumbar region: Secondary | ICD-10-CM | POA: Diagnosis not present

## 2016-05-16 DIAGNOSIS — M9902 Segmental and somatic dysfunction of thoracic region: Secondary | ICD-10-CM | POA: Diagnosis not present

## 2016-05-16 DIAGNOSIS — M531 Cervicobrachial syndrome: Secondary | ICD-10-CM | POA: Diagnosis not present

## 2016-05-16 DIAGNOSIS — M9901 Segmental and somatic dysfunction of cervical region: Secondary | ICD-10-CM | POA: Diagnosis not present

## 2016-05-23 DIAGNOSIS — M9901 Segmental and somatic dysfunction of cervical region: Secondary | ICD-10-CM | POA: Diagnosis not present

## 2016-05-23 DIAGNOSIS — M531 Cervicobrachial syndrome: Secondary | ICD-10-CM | POA: Diagnosis not present

## 2016-05-23 DIAGNOSIS — M9902 Segmental and somatic dysfunction of thoracic region: Secondary | ICD-10-CM | POA: Diagnosis not present

## 2016-05-23 DIAGNOSIS — M9903 Segmental and somatic dysfunction of lumbar region: Secondary | ICD-10-CM | POA: Diagnosis not present

## 2016-05-30 DIAGNOSIS — M9901 Segmental and somatic dysfunction of cervical region: Secondary | ICD-10-CM | POA: Diagnosis not present

## 2016-05-30 DIAGNOSIS — M531 Cervicobrachial syndrome: Secondary | ICD-10-CM | POA: Diagnosis not present

## 2016-05-30 DIAGNOSIS — M9903 Segmental and somatic dysfunction of lumbar region: Secondary | ICD-10-CM | POA: Diagnosis not present

## 2016-05-30 DIAGNOSIS — M9902 Segmental and somatic dysfunction of thoracic region: Secondary | ICD-10-CM | POA: Diagnosis not present

## 2016-06-06 DIAGNOSIS — M9901 Segmental and somatic dysfunction of cervical region: Secondary | ICD-10-CM | POA: Diagnosis not present

## 2016-06-06 DIAGNOSIS — M9902 Segmental and somatic dysfunction of thoracic region: Secondary | ICD-10-CM | POA: Diagnosis not present

## 2016-06-06 DIAGNOSIS — M531 Cervicobrachial syndrome: Secondary | ICD-10-CM | POA: Diagnosis not present

## 2016-06-06 DIAGNOSIS — M9903 Segmental and somatic dysfunction of lumbar region: Secondary | ICD-10-CM | POA: Diagnosis not present

## 2016-06-07 ENCOUNTER — Encounter: Payer: Self-pay | Admitting: Family

## 2016-06-07 ENCOUNTER — Other Ambulatory Visit: Payer: Self-pay | Admitting: Family

## 2016-06-08 MED ORDER — AMPHETAMINE-DEXTROAMPHETAMINE 10 MG PO TABS: 10.0000 mg | ORAL_TABLET | Freq: Two times a day (BID) | ORAL | 0 refills | Status: DC

## 2016-06-08 NOTE — Telephone Encounter (Signed)
Last Adderall RF:  04/13/16, #30 Last OV: 03/04/16 Next OV: due 09/01/16 UDS: 04/28/16  Rx printed and forwarded to PCP for signature.

## 2016-06-10 MED FILL — DEXTROAMP-AMPHETAMIN 10 MG: 10 | 30 days supply | Qty: 60 | Fill #0

## 2016-06-15 DIAGNOSIS — M9901 Segmental and somatic dysfunction of cervical region: Secondary | ICD-10-CM | POA: Diagnosis not present

## 2016-06-15 DIAGNOSIS — M9903 Segmental and somatic dysfunction of lumbar region: Secondary | ICD-10-CM | POA: Diagnosis not present

## 2016-06-15 DIAGNOSIS — M9902 Segmental and somatic dysfunction of thoracic region: Secondary | ICD-10-CM | POA: Diagnosis not present

## 2016-06-15 DIAGNOSIS — M531 Cervicobrachial syndrome: Secondary | ICD-10-CM | POA: Diagnosis not present

## 2016-07-18 ENCOUNTER — Other Ambulatory Visit: Payer: Self-pay | Admitting: Family

## 2016-07-18 MED ORDER — AMPHETAMINE-DEXTROAMPHETAMINE 10 MG PO TABS: 10.0000 mg | ORAL_TABLET | Freq: Two times a day (BID) | ORAL | 0 refills | Status: DC

## 2016-07-18 NOTE — Telephone Encounter (Signed)
Last Rf:  06/08/16, #60 Last OV: 03/04/16 Next OV:  Due 08/2016 UDS: 04/28/16, moderate and due 07/28/16  Rx printed and forwarded to PCP for signature.

## 2016-07-18 NOTE — Telephone Encounter (Signed)
Rx placed at front desk for pick up and message sent to pt. 

## 2016-07-19 MED FILL — DEXTROAMP-AMPHETAMIN 10 MG: 10 | 30 days supply | Qty: 60 | Fill #0

## 2016-07-26 DIAGNOSIS — L700 Acne vulgaris: Secondary | ICD-10-CM | POA: Diagnosis not present

## 2016-10-24 DIAGNOSIS — L812 Freckles: Secondary | ICD-10-CM | POA: Diagnosis not present

## 2016-10-24 DIAGNOSIS — L820 Inflamed seborrheic keratosis: Secondary | ICD-10-CM | POA: Diagnosis not present

## 2016-10-24 DIAGNOSIS — D485 Neoplasm of uncertain behavior of skin: Secondary | ICD-10-CM | POA: Diagnosis not present

## 2016-10-24 DIAGNOSIS — D2272 Melanocytic nevi of left lower limb, including hip: Secondary | ICD-10-CM | POA: Diagnosis not present

## 2016-10-24 DIAGNOSIS — D1801 Hemangioma of skin and subcutaneous tissue: Secondary | ICD-10-CM | POA: Diagnosis not present

## 2016-10-24 DIAGNOSIS — C44619 Basal cell carcinoma of skin of left upper limb, including shoulder: Secondary | ICD-10-CM | POA: Diagnosis not present

## 2016-10-24 DIAGNOSIS — Z85828 Personal history of other malignant neoplasm of skin: Secondary | ICD-10-CM | POA: Diagnosis not present

## 2016-11-02 ENCOUNTER — Other Ambulatory Visit: Payer: Self-pay | Admitting: Family

## 2016-11-02 NOTE — Telephone Encounter (Signed)
Rx sent to the pharmacy by e-script.//AB/CMA 

## 2016-11-07 ENCOUNTER — Telehealth: Payer: Self-pay | Admitting: Family

## 2016-11-08 MED ORDER — AMPHETAMINE-DEXTROAMPHETAMINE 10 MG PO TABS: 10.0000 mg | ORAL_TABLET | Freq: Two times a day (BID) | ORAL | 0 refills | Status: DC

## 2016-11-08 NOTE — Telephone Encounter (Signed)
Last adderall RX:  07/18/16, #60 Last OV:  03/04/16 Next OV:  Was due 09/01/16, past due UDS:  04/28/16 moderate, due 07/29/16. Will need to collect when she picks up Rx.

## 2016-11-09 MED FILL — DEXTROAMP-AMP 10 MG TAB: 10 | 30 days supply | Qty: 60 | Fill #0

## 2016-11-09 NOTE — Telephone Encounter (Signed)
Rx placed at front desk for pick up and message sent to pt. 

## 2016-11-09 NOTE — Telephone Encounter (Signed)
Patient scheduled for 11/25/16 with NP

## 2016-11-10 ENCOUNTER — Encounter: Payer: Self-pay | Admitting: Family

## 2016-11-10 DIAGNOSIS — Z79899 Other long term (current) drug therapy: Secondary | ICD-10-CM | POA: Diagnosis not present

## 2016-11-25 ENCOUNTER — Encounter: Payer: Self-pay | Admitting: Family

## 2016-11-25 ENCOUNTER — Ambulatory Visit (INDEPENDENT_AMBULATORY_CARE_PROVIDER_SITE_OTHER): Payer: BLUE CROSS/BLUE SHIELD | Admitting: Family

## 2016-11-25 VITALS — BP 110/70 | HR 73 | Temp 98.7°F | Resp 16 | Ht 63.0 in | Wt 151.8 lb

## 2016-11-25 DIAGNOSIS — F329 Major depressive disorder, single episode, unspecified: Secondary | ICD-10-CM | POA: Diagnosis not present

## 2016-11-25 DIAGNOSIS — R5383 Other fatigue: Secondary | ICD-10-CM

## 2016-11-25 DIAGNOSIS — F32A Depression, unspecified: Secondary | ICD-10-CM

## 2016-11-25 DIAGNOSIS — F909 Attention-deficit hyperactivity disorder, unspecified type: Secondary | ICD-10-CM | POA: Diagnosis not present

## 2016-11-25 DIAGNOSIS — J069 Acute upper respiratory infection, unspecified: Secondary | ICD-10-CM | POA: Diagnosis not present

## 2016-11-25 NOTE — Progress Notes (Signed)
Subjective:    Patient ID: Kristin Sandoval, female    DOB: 10/12/1985, 31 y.o.   MRN: [id number]  HPI  Kristin Sandoval is a 31 yr old female who presents today for follow up.  1) ADHD- She stopped adderall about 3 months ago.  Reports that she had a period of hopelessness the month after she stopped the medication. She reports that she feels burned out at her job.  Still has some concentration issues. Has been using adderal "PRN."   2) Depression- She reports she is sleeping better now. Has a harder time getting up in the morning.  Denies anxiety concerns.   Notes that she has been drinking on the weekends with friends. Feels like she might be using this as a coping mechanism.   3) Nasal congestion- reports nasal congestion/green mucous x 3 days. She has been taking zicam.    Reports that she has been "realy tired." reports that she took a round of abx for her face. Then abx were switch and it felt like she developed abd bloating on clindamycin.  Denies nausea or diarrhea.    Review of Systems    see HPI  Past Medical History:  Diagnosis Date  . ADHD (attention deficit hyperactivity disorder)    Adderall used  . Asthma    childhood  . Cancer ([facility])    skin cancer, no melanoma  . Complication of anesthesia    some difficulty awakening  . Headache    migraines in past- none recent     Social History   Social History  . Marital status: Single    Spouse name: N/A  . Number of children: 0  . Years of education: N/A   Occupational History  .  [employer]   Social History Main Topics  . Smoking status: Former Smoker    Types: Cigarettes    Quit date: 06/30/2006  . Smokeless tobacco: Never Used  . Alcohol use Yes     Comment: 1-3 drinks weekly  . Drug use: No  . Sexual activity: Yes    Birth control/ protection: Pill   Other Topics Concern  . Not on file   Social History Narrative   Caffeine Use:  1 cup coffee daily   Regular exercise:  4 x weekly          Past  Surgical History:  Procedure Laterality Date  . ADENOIDECTOMY     31 yrs old  . BREAST ENHANCEMENT SURGERY Bilateral 2014  . COLONOSCOPY WITH PROPOFOL N/A 03/21/2016   Procedure: COLONOSCOPY WITH PROPOFOL;  Surgeon: Leighton Ruff, MD;  Location: [facility] END[facility]COPY;  Service: Endoscopy;  Laterality: N/A;  . TONSILLECTOMY  4/15   due to tonsil stones    Family History  Problem Relation Age of Onset  . Alcohol abuse Father   . Arthritis Maternal Uncle   . Hyperlipidemia Maternal Uncle   . Alcohol abuse Paternal Aunt   . Arthritis Maternal Grandmother        DDD, spinal stenosis  . Hyperlipidemia Maternal Grandmother   . Alcohol abuse Maternal Grandfather   . Alcohol abuse Paternal Grandfather     Allergies  Allergen Reactions  . Apple Shortness Of Breath  . Acrylic Polymer [Carbomer]     Loses nail  . Latex     Latex "mild skin irritation"  . Other Other (See Comments)    STRAW / HAY.  SEVERE  ITCHING, CONGESTION.  . Adhesive [Tape] Rash  . Mango Flavor Rash  Current Outpatient Prescriptions on File Prior to Visit  Medication Sig Dispense Refill  . amphetamine-dextroamphetamine (ADDERALL) 10 MG tablet Take 1 tablet (10 mg total) by mouth 2 (two) times daily with a meal. 60 tablet 0  . ibuprofen (ADVIL,MOTRIN) 600 MG tablet 1 tab po q8h prn at onset of headache 30 tablet 3  . PROAIR HFA 108 (90 Base) MCG/ACT inhaler INHALE 2 PUFFS INTO THE LUNGS EVERY 6 (SIX) HOURS AS NEEDED FOR WHEEZING OR SHORTNESS OF BREATH. 8.5 Inhaler 0  . tretinoin (RETIN-A) 0.05 % cream Apply topically at bedtime. 45 g 0  . valACYclovir (VALTREX) 500 MG tablet TAKE 1 TABLET (500 MG TOTAL) BY MOUTH DAILY AS NEEDED. 30 tablet 11   No current facility-administered medications on file prior to visit.     BP 110/70 (BP Location: Right Arm, Cuff Size: Normal)   Pulse 73   Temp 98.7 F (37.1 C) (Oral)   Resp 16   Ht 5\' 3"  (1.6 m)   Wt 151 lb 12.8 oz (68.9 kg)   LMP 11/17/2016   SpO2 100%   BMI 26.89  kg/m    Objective:   Physical Exam  Constitutional: She appears well-developed and well-nourished.  Cardiovascular: Normal rate, regular rhythm and normal heart sounds.   No murmur heard. Pulmonary/Chest: Effort normal and breath sounds normal. No respiratory distress. She has no wheezes.  Psychiatric: She has a normal mood and affect. Her behavior is normal. Judgment and thought content normal.          Assessment & Plan:  ADHD- Advised pt that if she does not wish to take adderall daily, then I would recommend that she takes on her work days and ok to skip the weekends.  Depression- suspect some mild-moderate depression. Discussed having her establish with a therapist.   URI- symptoms sound viral. Pt is to call us if symptoms worsen or if symptoms fail to improve in 4 days.   Fatigue- check TSH, CBC.

## 2016-11-25 NOTE — Patient Instructions (Signed)
You can try Culturelle or Florastor probiotic daily. Try taking adderall on your work days.  Call if your cold symptoms are not resolved in 4 days.  Complete lab work prior to leaving.

## 2016-11-29 ENCOUNTER — Encounter: Payer: Self-pay | Admitting: Family

## 2016-11-29 ENCOUNTER — Other Ambulatory Visit (INDEPENDENT_AMBULATORY_CARE_PROVIDER_SITE_OTHER): Payer: BLUE CROSS/BLUE SHIELD

## 2016-11-29 DIAGNOSIS — R5383 Other fatigue: Secondary | ICD-10-CM

## 2016-11-29 LAB — CBC WITH DIFFERENTIAL/PLATELET
Basophils Absolute: 166 cells/uL (ref 0–200)
Basophils Relative: 2 %
EOS PCT: 2 %
Eosinophils Absolute: 166 cells/uL (ref 15–500)
HEMATOCRIT: 41.2 % (ref 35.0–45.0)
HEMOGLOBIN: 13.3 g/dL (ref 11.7–15.5)
LYMPHS ABS: 2822 {cells}/uL (ref 850–3900)
Lymphocytes Relative: 34 %
MCH: 29.5 pg (ref 27.0–33.0)
MCHC: 32.3 g/dL (ref 32.0–36.0)
MCV: 91.4 fL (ref 80.0–100.0)
MONO ABS: 747 {cells}/uL (ref 200–950)
MPV: 9.4 fL (ref 7.5–12.5)
Monocytes Relative: 9 %
NEUTROS ABS: 4399 {cells}/uL (ref 1500–7800)
Neutrophils Relative %: 53 %
Platelets: 264 10*3/uL (ref 140–400)
RBC: 4.51 MIL/uL (ref 3.80–5.10)
RDW: 14.3 % (ref 11.0–15.0)
WBC: 8.3 10*3/uL (ref 3.8–10.8)

## 2016-11-29 LAB — TSH: TSH: 0.78 m[IU]/L

## 2016-11-29 NOTE — Addendum Note (Signed)
Addended by: Caffie Pinto on: 11/29/2016 05:12 PM   Modules accepted: Orders

## 2016-11-30 LAB — URINALYSIS
BILIRUBIN URINE: NEGATIVE
GLUCOSE, UA: NEGATIVE
Hgb urine dipstick: NEGATIVE
Ketones, ur: NEGATIVE
LEUKOCYTES UA: NEGATIVE
Nitrite: NEGATIVE
PROTEIN: NEGATIVE
SPECIFIC GRAVITY, URINE: 1.02 (ref 1.001–1.035)
pH: 6 (ref 5.0–8.0)

## 2016-12-16 ENCOUNTER — Encounter: Payer: Self-pay | Admitting: Family

## 2016-12-16 DIAGNOSIS — R5383 Other fatigue: Secondary | ICD-10-CM

## 2016-12-22 NOTE — Telephone Encounter (Signed)
Kristin Sandoval or Kristin Sandoval--  Can you see future lab orders from 12/20/16 and contact the lab for out of pocket costs if insurance doesn't pay?

## 2016-12-27 ENCOUNTER — Other Ambulatory Visit: Payer: Self-pay | Admitting: Emergency Medicine

## 2016-12-27 DIAGNOSIS — R5383 Other fatigue: Secondary | ICD-10-CM

## 2016-12-27 NOTE — Telephone Encounter (Signed)
Kristin Sandoval-- pt asked for quotes on testing that she previously requested. I just sent these to her today as she said she might have to "cherry pick" certain tests due to cost. Awaiting pt's response.  Received approximate costs for above mentioned lab tests. B12, folate and iron (in house) $89.   AFP  $107 CA 125  $132 Vit D 1, 25  $155 Manganese  $104 Ceruloplasmin  $83 Chromium  $133

## 2017-01-03 NOTE — Telephone Encounter (Signed)
Sent message to the lab that pt will return on Friday, 01/06/17 to complete iron, b12 and folate only at this time and to hold other future orders at this time.

## 2017-01-06 ENCOUNTER — Other Ambulatory Visit: Payer: BLUE CROSS/BLUE SHIELD

## 2017-01-06 DIAGNOSIS — R5383 Other fatigue: Secondary | ICD-10-CM | POA: Diagnosis not present

## 2017-01-06 LAB — IRON: Iron: 149 ug/dL (ref 40–190)

## 2017-01-07 LAB — VITAMIN B12: Vitamin B-12: 310 pg/mL (ref 200–1100)

## 2017-01-07 LAB — FOLATE: FOLATE: 12.9 ng/mL (ref >5.4)

## 2017-01-24 ENCOUNTER — Encounter: Payer: Self-pay | Admitting: Family

## 2017-02-13 ENCOUNTER — Encounter: Payer: Self-pay | Admitting: Family

## 2017-02-14 ENCOUNTER — Encounter: Payer: Self-pay | Admitting: Family

## 2017-03-20 ENCOUNTER — Other Ambulatory Visit: Payer: Self-pay | Admitting: Family

## 2017-03-20 MED ORDER — AMPHETAMINE-DEXTROAMPHETAMINE 10 MG PO TABS: 10.0000 mg | ORAL_TABLET | Freq: Two times a day (BID) | ORAL | 0 refills | Status: DC

## 2017-03-20 NOTE — Telephone Encounter (Signed)
Rx placed at front desk, message sent to pt.

## 2017-03-20 NOTE — Telephone Encounter (Signed)
Last RX: 11/08/16, #60 Last OV: 11/25/16 Next OV: 03/22/17 UDS: 11/08/16, moderate. Due now and will collect at upcoming appt on 03/22/17/.  Rx printed and forwarded to PCP for signature.

## 2017-03-22 ENCOUNTER — Ambulatory Visit (INDEPENDENT_AMBULATORY_CARE_PROVIDER_SITE_OTHER): Payer: BLUE CROSS/BLUE SHIELD | Admitting: Family

## 2017-03-22 ENCOUNTER — Encounter: Payer: Self-pay | Admitting: Family

## 2017-03-22 VITALS — BP 120/79 | HR 77 | Temp 98.4°F | Resp 16 | Ht 63.0 in | Wt 152.0 lb

## 2017-03-22 DIAGNOSIS — F329 Major depressive disorder, single episode, unspecified: Secondary | ICD-10-CM

## 2017-03-22 DIAGNOSIS — Z113 Encounter for screening for infections with a predominantly sexual mode of transmission: Secondary | ICD-10-CM

## 2017-03-22 DIAGNOSIS — Z0000 Encounter for general adult medical examination without abnormal findings: Secondary | ICD-10-CM | POA: Diagnosis not present

## 2017-03-22 LAB — URINALYSIS, ROUTINE W REFLEX MICROSCOPIC
Bilirubin Urine: NEGATIVE
Hgb urine dipstick: NEGATIVE
KETONES UR: NEGATIVE
Leukocytes, UA: NEGATIVE
Nitrite: NEGATIVE
SPECIFIC GRAVITY, URINE: 1.015 (ref 1.000–1.030)
Total Protein, Urine: NEGATIVE
UROBILINOGEN UA: 0.2 (ref 0.0–1.0)
Urine Glucose: NEGATIVE
pH: 7 (ref 5.0–8.0)

## 2017-03-22 LAB — CBC WITH DIFFERENTIAL/PLATELET
BASOS ABS: 0 10*3/uL (ref 0.0–0.1)
BASOS PCT: 0.7 % (ref 0.0–3.0)
EOS ABS: 0.1 10*3/uL (ref 0.0–0.7)
Eosinophils Relative: 2 % (ref 0.0–5.0)
HCT: 41.5 % (ref 36.0–46.0)
Hemoglobin: 13.8 g/dL (ref 12.0–15.0)
Lymphocytes Relative: 32.5 % (ref 12.0–46.0)
Lymphs Abs: 2.3 10*3/uL (ref 0.7–4.0)
MCHC: 33.2 g/dL (ref 30.0–36.0)
MCV: 90.7 fl (ref 78.0–100.0)
MONO ABS: 0.6 10*3/uL (ref 0.1–1.0)
Monocytes Relative: 8.3 % (ref 3.0–12.0)
NEUTROS ABS: 4 10*3/uL (ref 1.4–7.7)
Neutrophils Relative %: 56.5 % (ref 43.0–77.0)
Platelets: 276 10*3/uL (ref 150.0–400.0)
RBC: 4.58 Mil/uL (ref 3.87–5.11)
RDW: 13.5 % (ref 11.5–15.5)
WBC: 7 10*3/uL (ref 4.0–10.5)

## 2017-03-22 LAB — LIPID PANEL
CHOL/HDL RATIO: 3
Cholesterol: 163 mg/dL (ref 0–200)
HDL: 54.5 mg/dL (ref >39.00)
LDL CALC: 91 mg/dL (ref 0–99)
NonHDL: 108.13
TRIGLYCERIDES: 87 mg/dL (ref 0.0–149.0)
VLDL: 17.4 mg/dL (ref 0.0–40.0)

## 2017-03-22 LAB — HEPATIC FUNCTION PANEL
ALT: 10 U/L (ref 0–35)
AST: 10 U/L (ref 0–37)
Albumin: 4.6 g/dL (ref 3.5–5.2)
Alkaline Phosphatase: 55 U/L (ref 39–117)
BILIRUBIN DIRECT: 0.1 mg/dL (ref 0.0–0.3)
BILIRUBIN TOTAL: 0.7 mg/dL (ref 0.2–1.2)
Total Protein: 7.9 g/dL (ref 6.0–8.3)

## 2017-03-22 LAB — BASIC METABOLIC PANEL
BUN: 11 mg/dL (ref 6–23)
CALCIUM: 9.6 mg/dL (ref 8.4–10.5)
CHLORIDE: 98 meq/L (ref 96–112)
CO2: 29 meq/L (ref 19–32)
CREATININE: 0.78 mg/dL (ref 0.40–1.20)
GFR: 90.45 mL/min (ref >60.00)
Glucose, Bld: 86 mg/dL (ref 70–99)
Potassium: 3.8 mEq/L (ref 3.5–5.1)
Sodium: 135 mEq/L (ref 135–145)

## 2017-03-22 LAB — TSH: TSH: 0.72 u[IU]/mL (ref 0.35–4.50)

## 2017-03-22 LAB — MAGNESIUM: Magnesium: 2.1 mg/dL (ref 1.5–2.5)

## 2017-03-22 MED ORDER — IBUPROFEN 600 MG PO TABS: ORAL_TABLET | ORAL | 3 refills | Status: AC

## 2017-03-22 MED ORDER — NITROFURANTOIN MONOHYD MACRO 100 MG PO CAPS: ORAL_CAPSULE | ORAL | 0 refills | Status: DC

## 2017-03-22 NOTE — Patient Instructions (Addendum)
Please schedule a routine eye exam. Continue to work on healthy diet, exercise and weight loss.  

## 2017-03-22 NOTE — Progress Notes (Signed)
Subjective:    Patient ID: Kristin Sandoval, female    DOB: 23-Aug-1985, 31 y.o.   MRN: [id number]  HPI  Patient presents today for complete physical.  Immunizations:  Tetanus 2016, declines flu shot.   Diet: reports that diet is fair  Wt Readings from Last 3 Encounters:  03/22/17 152 lb (68.9 kg)  11/25/16 151 lb 12.8 oz (68.9 kg)  03/21/16 140 lb (63.5 kg)  Exercise: some farm work Dental: up to date Vision: due  Reports some depression symptoms. Does not want to start medication.  Plans to start counseling next week.     UTI-reports that she had UTI a few weeks back.   Review of Systems  Constitutional: Negative for unexpected weight change.  HENT: Positive for rhinorrhea.   Respiratory: Negative for cough.   Cardiovascular: Negative for leg swelling.  Gastrointestinal: Negative for constipation and diarrhea.  Genitourinary: Negative for dysuria and frequency.  Musculoskeletal: Negative for arthralgias and myalgias.  Skin: Negative for rash.  Neurological: Negative for headaches.  Hematological: Negative for adenopathy.  Psychiatric/Behavioral:       See HPI     Past Medical History:  Diagnosis Date  . ADHD (attention deficit hyperactivity disorder)    Adderall used  . Asthma    childhood  . Complication of anesthesia    some difficulty awakening  . Headache    migraines in past- none recent  . Skin cancer    basal cell carcinoma     Social History   Social History  . Marital status: Single    Spouse name: N/A  . Number of children: 0  . Years of education: N/A   Occupational History  .  [employer]   Social History Main Topics  . Smoking status: Former Smoker    Types: Cigarettes    Quit date: 06/30/2006  . Smokeless tobacco: Never Used  . Alcohol use Yes     Comment: 1-3 drinks weekly  . Drug use: No  . Sexual activity: Yes    Birth control/ protection: Pill   Other Topics Concern  . Not on file   Social History Narrative   Caffeine Use:   1 cup coffee daily   Regular exercise:  4 x weekly          Past Surgical History:  Procedure Laterality Date  . ADENOIDECTOMY     31 yrs old  . BREAST ENHANCEMENT SURGERY Bilateral 2014  . COLONOSCOPY WITH PROPOFOL N/A 03/21/2016   Procedure: COLONOSCOPY WITH PROPOFOL;  Surgeon: Leighton Ruff, MD;  Location: [facility];  Service: Endoscopy;  Laterality: N/A;  . TONSILLECTOMY  4/15   due to tonsil stones    Family History  Problem Relation Age of Onset  . Alcohol abuse Father   . Arthritis Maternal Uncle   . Hyperlipidemia Maternal Uncle   . Alcohol abuse Paternal Aunt   . Arthritis Maternal Grandmother        DDD, spinal stenosis  . Hyperlipidemia Maternal Grandmother   . Alcohol abuse Maternal Grandfather   . Alcohol abuse Paternal Grandfather     Allergies  Allergen Reactions  . Apple Shortness Of Breath  . Acrylic Polymer [Carbomer]     Loses nail  . Latex     Latex "mild skin irritation"  . Other Other (See Comments)    STRAW / HAY.  SEVERE  ITCHING, CONGESTION.  . Adhesive [Tape] Rash  . Mango Flavor Rash    Current Outpatient Prescriptions on File Prior  to Visit  Medication Sig Dispense Refill  . amphetamine-dextroamphetamine (ADDERALL) 10 MG tablet Take 1 tablet (10 mg total) by mouth 2 (two) times daily with a meal. 60 tablet 0  . PROAIR HFA 108 (90 Base) MCG/ACT inhaler INHALE 2 PUFFS INTO THE LUNGS EVERY 6 (SIX) HOURS AS NEEDED FOR WHEEZING OR SHORTNESS OF BREATH. 8.5 Inhaler 0  . tretinoin (RETIN-A) 0.05 % cream Apply topically at bedtime. 45 g 0  . valACYclovir (VALTREX) 500 MG tablet TAKE 1 TABLET (500 MG TOTAL) BY MOUTH DAILY AS NEEDED. 30 tablet 11   No current facility-administered medications on file prior to visit.     BP 120/79 (BP Location: Right Arm, Cuff Size: Normal)   Pulse 77   Temp 98.4 F (36.9 C) (Oral)   Resp 16   Ht 5\' 3"  (1.6 m)   Wt 152 lb (68.9 kg)   LMP 02/23/2017   SpO2 100%   BMI 26.93 kg/m         Objective:     Physical Exam  Physical Exam  Constitutional: She is oriented to person, place, and time. She appears well-developed and well-nourished. No distress.  HENT:  Head: Normocephalic and atraumatic.  Right Ear: Tympanic membrane and ear canal normal.  Left Ear: Tympanic membrane and ear canal normal.  Mouth/Throat: Oropharynx is clear and moist.  Eyes: Pupils are equal, round, and reactive to light. No scleral icterus.  Neck: Normal range of motion. No thyromegaly present.  Cardiovascular: Normal rate and regular rhythm.   No murmur heard. Pulmonary/Chest: Effort normal and breath sounds normal. No respiratory distress. He has no wheezes. She has no rales. She exhibits no tenderness.  Abdominal: Soft. Bowel sounds are normal. She exhibits no distension and no mass. There is no tenderness. There is no rebound and no guarding.  Musculoskeletal: She exhibits no edema.  Lymphadenopathy:    She has no cervical adenopathy.  Neurological: She is alert and oriented to person, place, and time. She has normal patellar reflexes. She exhibits normal muscle tone. Coordination normal.  Skin: Skin is warm and dry.  Psychiatric: She has a normal mood and affect. Her behavior is normal. Judgment and thought content normal.  Breast/pelvic: deferred to GYN           Assessment & Plan:         Assessment & Plan:  Preventative care- discussed healthy diet, exercise and weight loss. Declines flu shot. Request STD blood work. Also requests manganese, magnesium level- understands that this may not be covered by her insurance.   Depression- scored 10 on PHQ-9. Advised pt to follow up in 3 months and let us know sooner if symptoms worsen or if they do not improve with her planned counseling.

## 2017-03-26 LAB — VITAMIN D 1,25 DIHYDROXY
VITAMIN D 1, 25 (OH) TOTAL: 35 pg/mL (ref 18–72)
Vitamin D3 1, 25 (OH)2: 35 pg/mL

## 2017-03-26 LAB — HIV ANTIBODY (ROUTINE TESTING W REFLEX): HIV 1&2 Ab, 4th Generation: NONREACTIVE

## 2017-03-26 LAB — MANGANESE: MANGANESE, BLOOD: 0.9 ug/L (ref <1.2)

## 2017-03-26 LAB — RPR: RPR Ser Ql: NONREACTIVE

## 2017-03-26 LAB — HSV 2 ANTIBODY, IGG: HSV 2 Glycoprotein G Ab, IgG: <0.9 index

## 2017-03-26 LAB — HSV 1 ANTIBODY, IGG: HSV 1 Glycoprotein G Ab, IgG: 42.3 index — ABNORMAL HIGH

## 2017-03-27 ENCOUNTER — Encounter: Payer: Self-pay | Admitting: Family

## 2017-03-27 DIAGNOSIS — F331 Major depressive disorder, recurrent, moderate: Secondary | ICD-10-CM | POA: Diagnosis not present

## 2017-03-28 ENCOUNTER — Other Ambulatory Visit: Payer: Self-pay | Admitting: Family

## 2017-03-28 MED ORDER — VENLAFAXINE HCL ER 37.5 MG PO CP24: ORAL_CAPSULE | ORAL | 0 refills | Status: DC

## 2017-03-28 MED FILL — VENLAFAXINE HCL ER 75 MG CA: 75 | 30 days supply | Qty: 30 | Fill #0

## 2017-03-28 MED FILL — VENLAFAXINE HCL ER 37.5 MG: 37.5 | 7 days supply | Qty: 7 | Fill #0

## 2017-04-04 NOTE — Telephone Encounter (Signed)
Could you please check status of effexor rx? Rx should have been filled for #60 tabs on 10/9 so this should cover her for more than 1 month since she is only taking 1 tablet/day.

## 2017-04-05 DIAGNOSIS — Z01419 Encounter for gynecological examination (general) (routine) without abnormal findings: Secondary | ICD-10-CM | POA: Diagnosis not present

## 2017-04-05 DIAGNOSIS — Z6826 Body mass index (BMI) 26.0-26.9, adult: Secondary | ICD-10-CM | POA: Diagnosis not present

## 2017-04-05 DIAGNOSIS — Z113 Encounter for screening for infections with a predominantly sexual mode of transmission: Secondary | ICD-10-CM | POA: Diagnosis not present

## 2017-04-07 MED ORDER — VENLAFAXINE HCL ER 37.5 MG PO CP24: 37.5000 mg | ORAL_CAPSULE | Freq: Every day | ORAL | 2 refills | Status: DC

## 2017-04-07 NOTE — Telephone Encounter (Signed)
Spoke with Lesly Rubenstein at [facility] and she confirms that pt picked up #7 tablets of 37.5mg  and #30 tablets of 75mg  on 03/28/17 due to insurance purposes. Sent message to pt and re-sent [facility] to [facility] for Effexor XR 37.5mg  once a day.

## 2017-04-17 ENCOUNTER — Encounter: Payer: Self-pay | Admitting: Family

## 2017-05-01 MED FILL — DEXTROAMP-AMP 10 MG TAB: 10 | 30 days supply | Qty: 60 | Fill #0

## 2017-05-03 DIAGNOSIS — F331 Major depressive disorder, recurrent, moderate: Secondary | ICD-10-CM | POA: Diagnosis not present

## 2017-05-08 ENCOUNTER — Encounter: Payer: Self-pay | Admitting: Family

## 2017-05-08 DIAGNOSIS — L821 Other seborrheic keratosis: Secondary | ICD-10-CM | POA: Diagnosis not present

## 2017-05-08 DIAGNOSIS — D225 Melanocytic nevi of trunk: Secondary | ICD-10-CM | POA: Diagnosis not present

## 2017-05-08 DIAGNOSIS — Z85828 Personal history of other malignant neoplasm of skin: Secondary | ICD-10-CM | POA: Diagnosis not present

## 2017-05-08 DIAGNOSIS — D485 Neoplasm of uncertain behavior of skin: Secondary | ICD-10-CM | POA: Diagnosis not present

## 2017-05-08 MED ORDER — VENLAFAXINE HCL ER 75 MG PO CP24: 75.0000 mg | ORAL_CAPSULE | Freq: Every day | ORAL | 0 refills | Status: DC

## 2017-05-22 ENCOUNTER — Telehealth: Payer: Self-pay | Admitting: Family

## 2017-05-22 NOTE — Telephone Encounter (Signed)
Left voice message to check mychart message from 05/08/17 and respond.

## 2017-05-22 NOTE — Telephone Encounter (Signed)
Please let pt know that we sent rx for wellbutrin refill but she needs an office visit prior to additional refills for follow up.

## 2017-05-24 DIAGNOSIS — F331 Major depressive disorder, recurrent, moderate: Secondary | ICD-10-CM | POA: Diagnosis not present

## 2017-05-25 ENCOUNTER — Encounter: Payer: Self-pay | Admitting: Family

## 2017-06-06 DIAGNOSIS — F331 Major depressive disorder, recurrent, moderate: Secondary | ICD-10-CM | POA: Diagnosis not present

## 2017-06-07 ENCOUNTER — Encounter: Payer: Self-pay | Admitting: Family

## 2017-06-09 ENCOUNTER — Encounter: Payer: Self-pay | Admitting: Family

## 2017-06-09 ENCOUNTER — Ambulatory Visit (INDEPENDENT_AMBULATORY_CARE_PROVIDER_SITE_OTHER): Payer: BLUE CROSS/BLUE SHIELD | Admitting: Family

## 2017-06-09 ENCOUNTER — Ambulatory Visit: Payer: Self-pay | Admitting: Family

## 2017-06-09 ENCOUNTER — Other Ambulatory Visit: Payer: Self-pay | Admitting: Family

## 2017-06-09 VITALS — BP 104/88 | HR 109 | Temp 98.3°F | Resp 16 | Ht 63.0 in | Wt 154.6 lb

## 2017-06-09 DIAGNOSIS — R229 Localized swelling, mass and lump, unspecified: Secondary | ICD-10-CM

## 2017-06-09 DIAGNOSIS — F329 Major depressive disorder, single episode, unspecified: Secondary | ICD-10-CM | POA: Diagnosis not present

## 2017-06-09 DIAGNOSIS — R1907 Generalized intra-abdominal and pelvic swelling, mass and lump: Secondary | ICD-10-CM | POA: Diagnosis not present

## 2017-06-09 DIAGNOSIS — F909 Attention-deficit hyperactivity disorder, unspecified type: Secondary | ICD-10-CM | POA: Diagnosis not present

## 2017-06-09 DIAGNOSIS — F419 Anxiety disorder, unspecified: Secondary | ICD-10-CM | POA: Diagnosis not present

## 2017-06-09 DIAGNOSIS — IMO0002 Reserved for concepts with insufficient information to code with codable children: Secondary | ICD-10-CM

## 2017-06-09 DIAGNOSIS — F32A Depression, unspecified: Secondary | ICD-10-CM

## 2017-06-09 LAB — BASIC METABOLIC PANEL
BUN: 12 mg/dL (ref 6–23)
CHLORIDE: 101 meq/L (ref 96–112)
CO2: 28 meq/L (ref 19–32)
CREATININE: 0.88 mg/dL (ref 0.40–1.20)
Calcium: 8.8 mg/dL (ref 8.4–10.5)
GFR: 78.59 mL/min (ref >60.00)
GLUCOSE: 83 mg/dL (ref 70–99)
POTASSIUM: 4 meq/L (ref 3.5–5.1)
Sodium: 135 mEq/L (ref 135–145)

## 2017-06-09 MED ORDER — VALACYCLOVIR HCL 500 MG PO TABS: ORAL_TABLET | ORAL | 11 refills | Status: DC

## 2017-06-09 MED ORDER — AMPHETAMINE-DEXTROAMPHETAMINE 10 MG PO TABS: 10.0000 mg | ORAL_TABLET | Freq: Two times a day (BID) | ORAL | 0 refills | Status: DC

## 2017-06-09 MED ORDER — ALBUTEROL SULFATE HFA 108 (90 BASE) MCG/ACT IN AERS: INHALATION_SPRAY | RESPIRATORY_TRACT | 0 refills | Status: AC

## 2017-06-09 MED ORDER — VENLAFAXINE HCL ER 150 MG PO CP24
150.0000 mg | ORAL_CAPSULE | Freq: Every day | ORAL | 1 refills | Status: DC
Start: 2017-06-09 — End: 2017-07-14

## 2017-06-09 MED FILL — PROAIR HFA 90 MCG INHALER: 108 (90 BAS | 25 days supply | Qty: 9 | Fill #0

## 2017-06-09 MED FILL — VENLAFAXINE HCL ER 150 MG C: 150 | 30 days supply | Qty: 30 | Fill #0

## 2017-06-09 MED FILL — DEXTROAMP-AMP 10 MG TAB: 10 | 30 days supply | Qty: 60 | Fill #0

## 2017-06-09 MED FILL — VALACYCLOVIR HCL 500 MG TAB: 500 | 30 days supply | Qty: 30 | Fill #0

## 2017-06-09 NOTE — Patient Instructions (Addendum)
Please increase your effexor to 150mg  once daily. Continue your work with the counselor. Reschedule appointment with dermatology. Complete lab work prior to leaving.

## 2017-06-09 NOTE — Progress Notes (Signed)
Subjective:    Patient ID: Kristin Sandoval, female    DOB: 1985-11-30, 31 y.o.   MRN: [id number]  HPI  Kristin Sandoval is a 31 yr old female who presents today for follow up.  Depression- last visit she discussed worsening depression symptoms and opted for counseling rather than medications. She saw psychology and they recommended that she start back on medication.  She began effexor 37.5 and then was increased to 75mg .  Reports that the hopeless thoughts are gone since she started the effexor.    Anxiety is worse.  Just completed finals, works full time.  She reports that she has had more presenting at school which is a source of anxiety.  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 Has been having panic attacks.    Reports that anxiety  ADHD- reports that her adhd symptoms are "bad."    Reports that she had a mole removed on her leg and was a dysplastic nevus with severe atypia.  She missed her appointment for deeper excision.  This has been present since 2013.  She is concerned that the area in her lower back which was unremarkable and on ultrasound is a lymph node due to possible metastasis.  She is also concerned about a "lymph node" in her groin.  Review of Systems See HPI  Past Medical History:  Diagnosis Date  . ADHD (attention deficit hyperactivity disorder)    Adderall used  . Asthma    childhood  . Complication of anesthesia    some difficulty awakening  . Headache    migraines in past- none recent  . Skin cancer    basal cell carcinoma     Social History   Socioeconomic History  . Marital status: Single    Spouse name: Not on file  . Number of children: 0  . Years of education: Not on file  . Highest education level: Not on file  Social Needs  . Financial resource strain: Not on file  . Food insecurity - worry: Not on file  . Food insecurity - inability: Not on file  . Transportation needs - medical: Not on file  . Transportation needs - non-medical: Not on  file  Occupational History    Employer: [employer]  Tobacco Use  . Smoking status: Former Smoker    Types: Cigarettes    Last attempt to quit: 06/30/2006    Years since quitting: 10.9  . Smokeless tobacco: Never Used  Substance and Sexual Activity  . Alcohol use: Yes    Comment: 1-3 drinks weekly  . Drug use: No  . Sexual activity: Yes    Birth control/protection: Pill  Other Topics Concern  . Not on file  Social History Narrative   Caffeine Use:  1 cup coffee daily   Regular exercise:  4 x weekly          Past Surgical History:  Procedure Laterality Date  . ADENOIDECTOMY     31 yrs old  . BREAST ENHANCEMENT SURGERY Bilateral 2014  . COLONOSCOPY WITH PROPOFOL N/A 03/21/2016   Procedure: COLONOSCOPY WITH PROPOFOL;  Surgeon: Leighton Ruff, MD;  Location: [facility];  Service: Endoscopy;  Laterality: N/A;  . TONSILLECTOMY  4/15   due to tonsil stones    Family History  Problem Relation Age of Onset  . Alcohol abuse Father   . Arthritis Maternal Uncle   . Hyperlipidemia Maternal Uncle   . Alcohol abuse Paternal Aunt   . Arthritis Maternal Grandmother  DDD, spinal stenosis  . Hyperlipidemia Maternal Grandmother   . Alcohol abuse Maternal Grandfather   . Alcohol abuse Paternal Grandfather     Allergies  Allergen Reactions  . Apple Shortness Of Breath  . Acrylic Polymer [Carbomer]     Loses nail  . Latex     Latex "mild skin irritation"  . Other Other (See Comments)    STRAW / HAY.  SEVERE  ITCHING, CONGESTION.  . Adhesive [Tape] Rash  . Mango Flavor Rash    Current Outpatient Medications on File Prior to Visit  Medication Sig Dispense Refill  . ibuprofen (ADVIL,MOTRIN) 600 MG tablet 1 tab po q8h prn at onset of headache 30 tablet 3  . nitrofurantoin, macrocrystal-monohydrate, (MACROBID) 100 MG capsule 1 tablet by mouth once after intercourse to prevent UTI 30 capsule 0  . tretinoin (RETIN-A) 0.05 % cream Apply topically at bedtime. 45 g 0   No current  facility-administered medications on file prior to visit.     BP 104/88 (BP Location: Right Arm, Patient Position: Sitting, Cuff Size: Small)   Pulse (!) 109   Temp 98.3 F (36.8 C) (Oral)   Resp 16   Ht 5\' 3"  (1.6 m)   Wt 154 lb 9.6 oz (70.1 kg)   LMP 06/07/2017   SpO2 100%   BMI 27.39 kg/m       Objective:   Physical Exam  Constitutional: She appears well-developed and well-nourished.  Cardiovascular: Normal rate, regular rhythm and normal heart sounds.  No murmur heard. Pulmonary/Chest: Effort normal and breath sounds normal. No respiratory distress. She has no wheezes.  Genitourinary:  Genitourinary Comments: Question lipomatous type mass in the left vulva.  Musculoskeletal:  Soft mobile mass noted left lower back and soft tissue.  Psychiatric: She has a normal mood and affect. Her behavior is normal. Judgment and thought content normal.  Lymph: No inguinal lymphadenopathy is noted.       Assessment & Plan:  Soft tissue mass-left lower back.  I really believe that this is a lipoma.  She remains very concerned about it.  We will pursue MRI for further evaluation.  Vulvar abnormality-I offered to set her up with GYN for further evaluation.  She tells me that she will make these arrangements.  I do not think that she has any inguinal lymphadenopathy.  Depression-stable on Effexor.  Anxiety-this has worsened.  Will increase Effexor to 150 mg once daily extended release and plan to have her continue her follow-up with her counselor.  ADHD-this is not optimally controlled right now on Adderall.  I suspect that her anxiety is contributing to this.  Continue current dose of Adderall for now.  Obtain urine drug screen per protocol.

## 2017-06-13 ENCOUNTER — Telehealth: Payer: Self-pay | Admitting: Family

## 2017-06-13 DIAGNOSIS — N912 Amenorrhea, unspecified: Secondary | ICD-10-CM

## 2017-06-13 NOTE — Telephone Encounter (Signed)
Lab was unable to add on HCG. Could you please arrange a nurse visit for urine hcg. She can do it the same day as her mri if that is more convenient

## 2017-06-14 LAB — PAIN MGMT, PROFILE 8 W/CONF, U
6 ACETYLMORPHINE: NEGATIVE ng/mL (ref <10)
AMPHETAMINE: 3630 ng/mL — AB (ref <250)
AMPHETAMINES: POSITIVE ng/mL — AB (ref <500)
Alcohol Metabolites: NEGATIVE ng/mL (ref <500)
Benzodiazepines: NEGATIVE ng/mL (ref <100)
Buprenorphine, Urine: NEGATIVE ng/mL (ref <5)
CREATININE: 327.3 mg/dL
Cocaine Metabolite: NEGATIVE ng/mL (ref <150)
MARIJUANA METABOLITE: 9 ng/mL — AB (ref <5)
MDMA: NEGATIVE ng/mL (ref <500)
Marijuana Metabolite: POSITIVE ng/mL — AB (ref <20)
Methamphetamine: NEGATIVE ng/mL (ref <250)
OPIATES: NEGATIVE ng/mL (ref <100)
OXIDANT: NEGATIVE ug/mL (ref <200)
OXYCODONE: NEGATIVE ng/mL (ref <100)
PH: 6.85 (ref 4.5–9.0)

## 2017-06-14 NOTE — Telephone Encounter (Signed)
Waiting for MRI to be scheduled to set up ua pregnancy for the patient here in our office, same day.

## 2017-06-14 NOTE — Telephone Encounter (Signed)
Lm with no details on patient's phone for her to call us back. She needs to be aware she needs a pregnancy test before MRI, unfortunately MRI has been scheduled for Saturday when our office is closed. She will need to be scheduled for a lab visit for ua pregnancy test for Thursday or Friday.

## 2017-06-15 ENCOUNTER — Encounter: Payer: Self-pay | Admitting: Family

## 2017-06-15 NOTE — Telephone Encounter (Signed)
Per patient she will be here tomorrow afternoon for urine pregnancy test.

## 2017-06-15 NOTE — Telephone Encounter (Signed)
Talked to patient and gave her the available times for tomorrow and Friday at the lab schedule. She said she will email me back to let me know when she can come in.

## 2017-06-15 NOTE — Telephone Encounter (Signed)
See telephone note 06/13/2017.

## 2017-06-16 ENCOUNTER — Other Ambulatory Visit (INDEPENDENT_AMBULATORY_CARE_PROVIDER_SITE_OTHER): Payer: BLUE CROSS/BLUE SHIELD

## 2017-06-16 ENCOUNTER — Encounter: Payer: Self-pay | Admitting: Family

## 2017-06-16 DIAGNOSIS — N912 Amenorrhea, unspecified: Secondary | ICD-10-CM | POA: Diagnosis not present

## 2017-06-16 LAB — POCT URINE PREGNANCY: Preg Test, Ur: NEGATIVE

## 2017-06-16 NOTE — Addendum Note (Signed)
Addended by: Harl Bowie on: 06/16/2017 02:01 PM   Modules accepted: Orders

## 2017-06-17 ENCOUNTER — Ambulatory Visit (HOSPITAL_BASED_OUTPATIENT_CLINIC_OR_DEPARTMENT_OTHER)
Admission: RE | Admit: 2017-06-17 | Discharge: 2017-06-17 | Disposition: A | Discharge status: Home / Self Care | Payer: BLUE CROSS/BLUE SHIELD | Source: Ambulatory Visit | Attending: Family | Admitting: Family

## 2017-06-17 DIAGNOSIS — R590 Localized enlarged lymph nodes: Secondary | ICD-10-CM | POA: Diagnosis not present

## 2017-06-17 DIAGNOSIS — R1907 Generalized intra-abdominal and pelvic swelling, mass and lump: Secondary | ICD-10-CM | POA: Insufficient documentation

## 2017-06-17 MED ORDER — GADOBENATE DIMEGLUMINE 529 MG/ML IV SOLN
10.0000 mL | Freq: Once | INTRAVENOUS | Status: AC | PRN
  Administered 2017-06-17: 10 mL via INTRAVENOUS

## 2017-06-19 ENCOUNTER — Encounter: Payer: Self-pay | Admitting: Family

## 2017-06-21 ENCOUNTER — Other Ambulatory Visit: Payer: Self-pay | Admitting: Family

## 2017-06-21 MED ORDER — VENLAFAXINE HCL ER 75 MG PO CP24: 75.0000 mg | ORAL_CAPSULE | Freq: Every day | ORAL | 1 refills | Status: DC

## 2017-07-07 ENCOUNTER — Encounter: Payer: Self-pay | Admitting: Family

## 2017-07-07 ENCOUNTER — Other Ambulatory Visit: Payer: Self-pay | Admitting: Family

## 2017-07-14 ENCOUNTER — Telehealth: Payer: Self-pay | Admitting: *Deleted

## 2017-07-14 MED ORDER — VENLAFAXINE HCL ER 150 MG PO CP24: 150.0000 mg | ORAL_CAPSULE | Freq: Every day | ORAL | 0 refills | Status: DC

## 2017-07-14 NOTE — Telephone Encounter (Signed)
[facility] venlafaxine 75mg .  Per pt email of 07/07/17, pt was instructed to increase medication to 150mg  once a day. Previous Rx was sent to [facility]. [facility] at [facility] and advised him to cancel 75mg  request and I will send 150mg  rx. Refill sent.

## 2017-07-26 ENCOUNTER — Encounter: Payer: Self-pay | Admitting: Family

## 2017-09-13 IMAGING — US US PELVIS LIMITED
1 series · 7 of 7 positions shown · non-contrast
Comparison: None.

CLINICAL DATA: Subcutaneous left lower back mass for 1 month.

EXAM:
ULTRASOUND OF BACK SOFT TISSUES
TECHNIQUE: Ultrasound examination of the groin soft tissues was performed in
the area of clinical concern.

[Series 1: us pelvis limited · 0.06mm/px · 7 acquisitions, 7 frames shown]
[im 1/7]
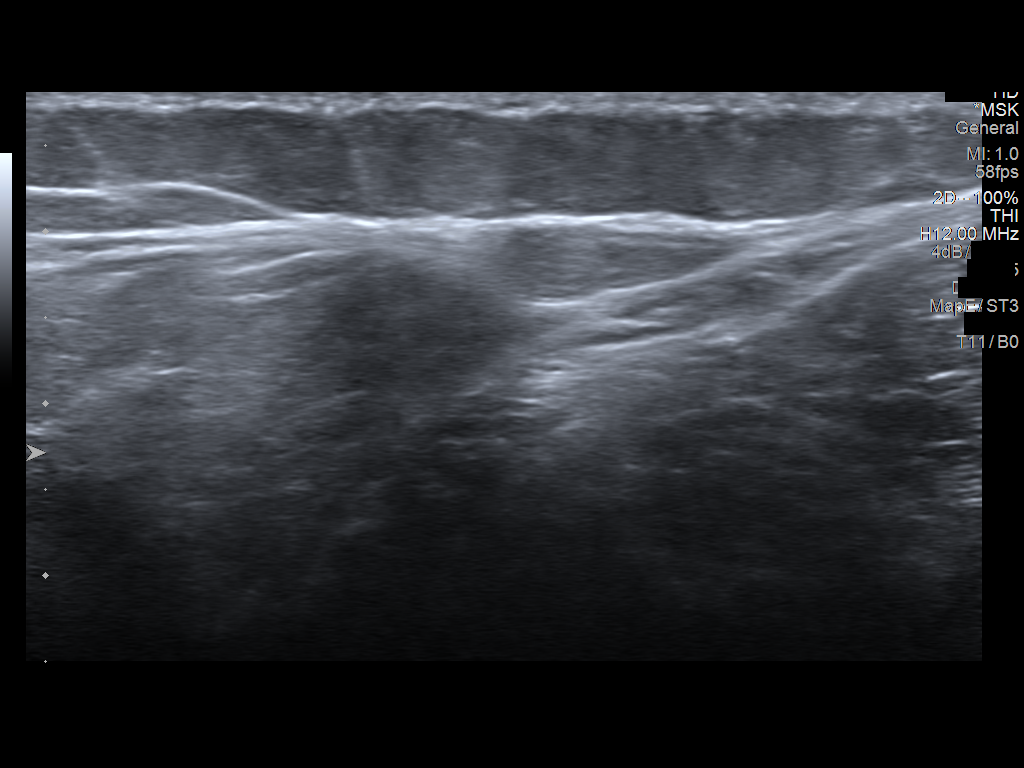
[im 2/7]
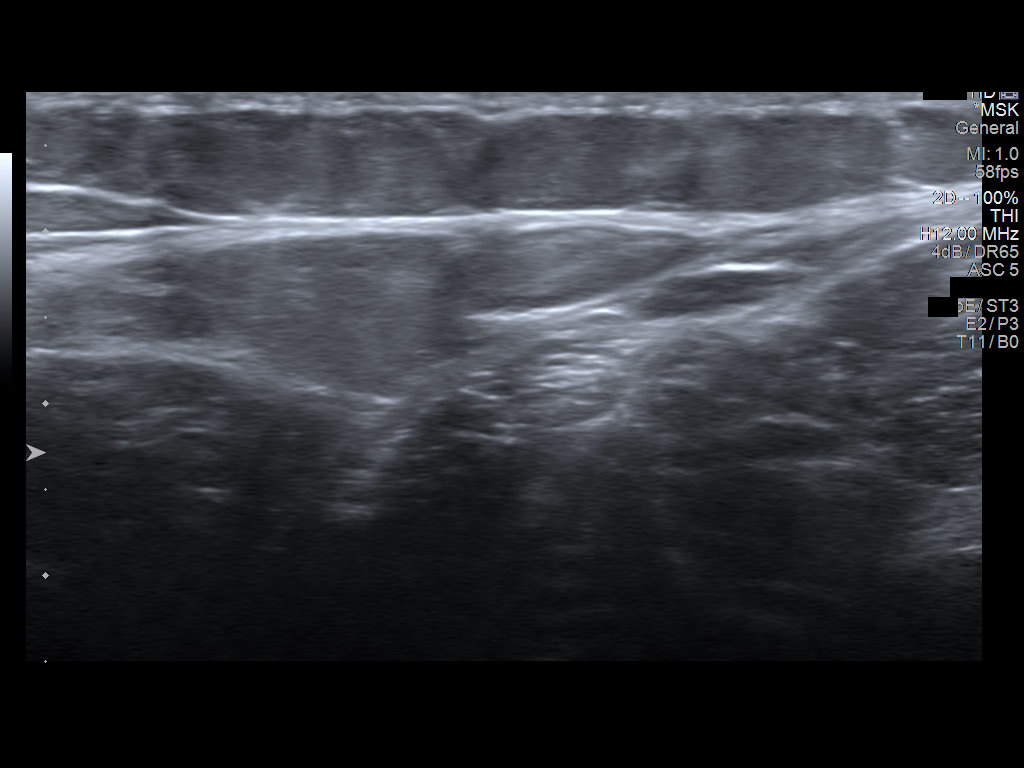
[im 3/7]
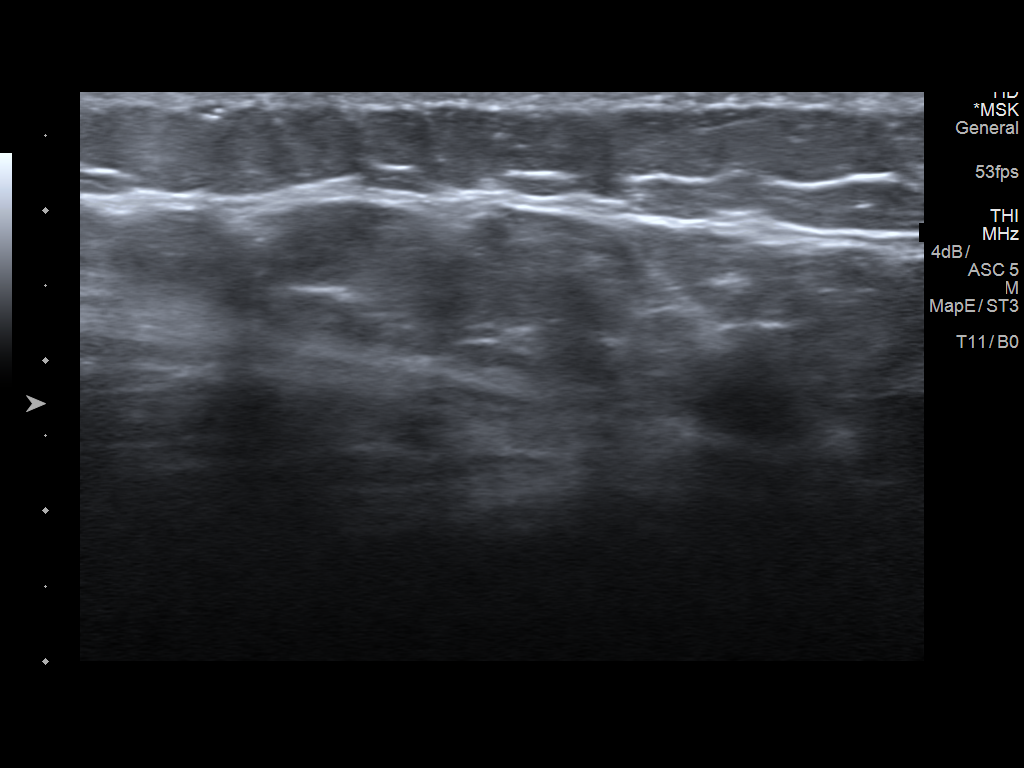
[im 4/7]
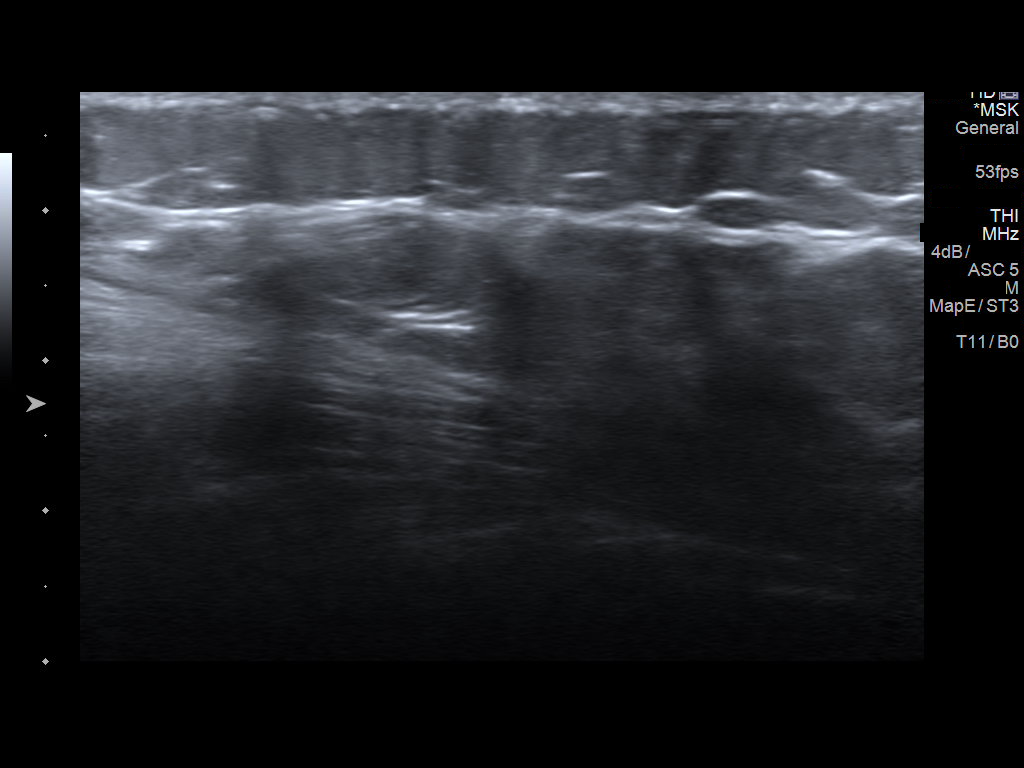
[im 5/7]
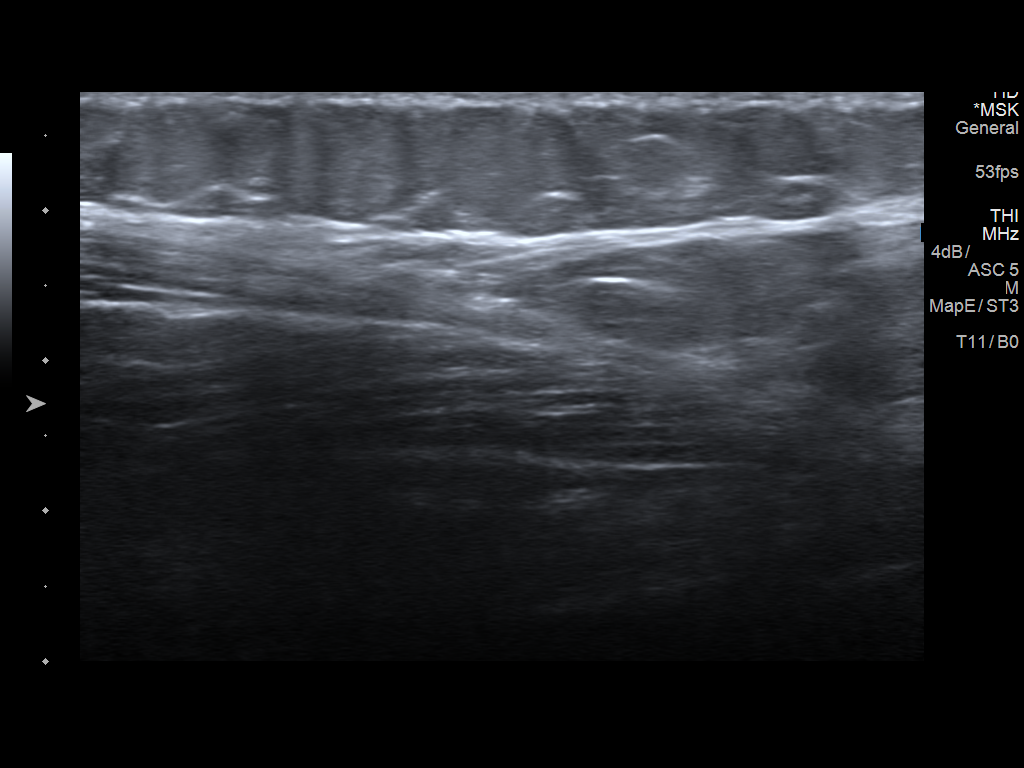
[im 6/7]
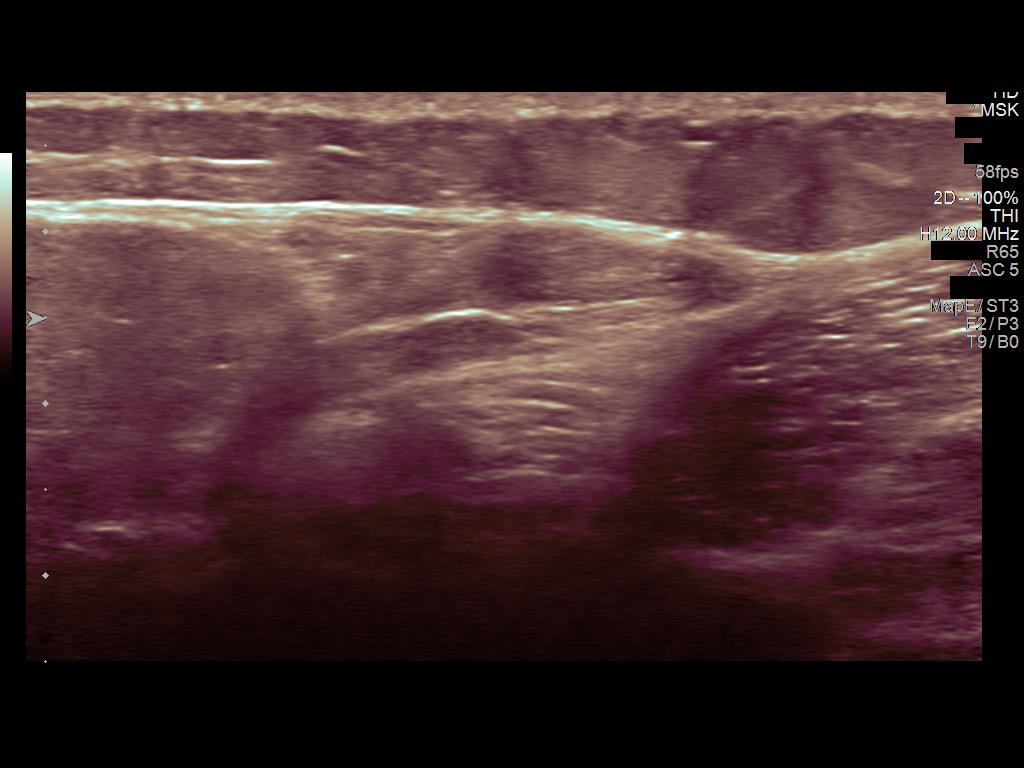
[im 7/7]
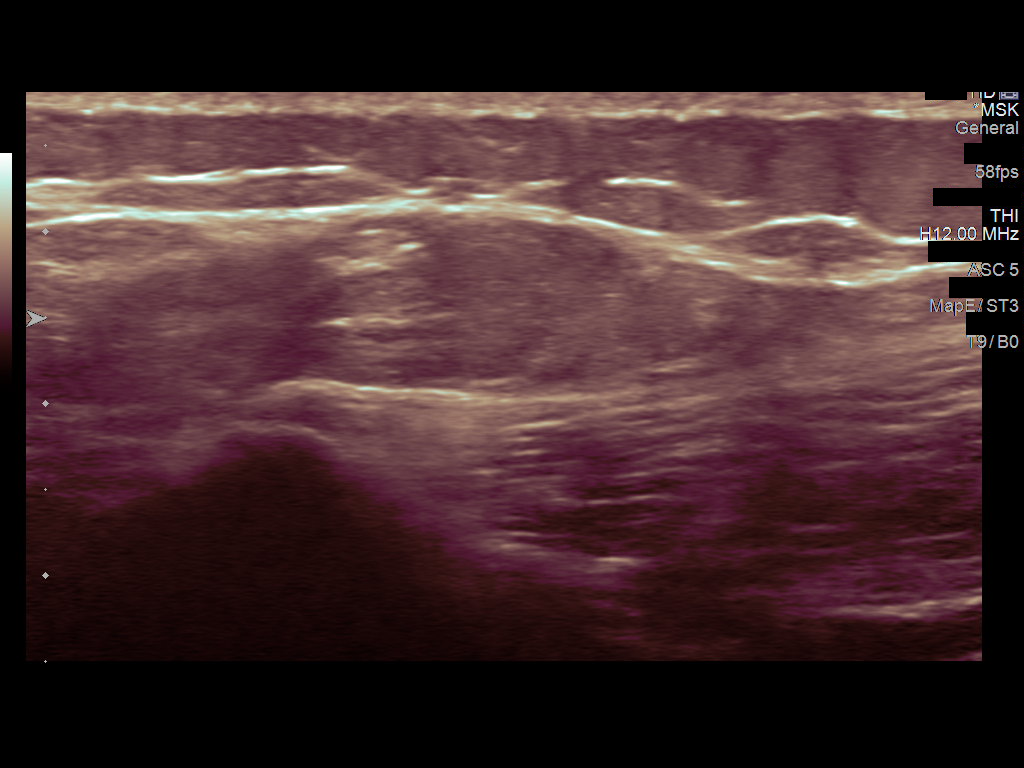

[7 of 7 positions shown; findings below may reference images not displayed]

FINDINGS: No cystic or solid abnormalities identified. If symptoms persist
gadolinium-enhanced MRI can be obtained.
IMPRESSION: Negative exam.

## 2017-11-06 DIAGNOSIS — Z85828 Personal history of other malignant neoplasm of skin: Secondary | ICD-10-CM | POA: Diagnosis not present

## 2017-11-06 DIAGNOSIS — L812 Freckles: Secondary | ICD-10-CM | POA: Diagnosis not present

## 2017-11-06 DIAGNOSIS — D485 Neoplasm of uncertain behavior of skin: Secondary | ICD-10-CM | POA: Diagnosis not present

## 2017-11-06 DIAGNOSIS — D225 Melanocytic nevi of trunk: Secondary | ICD-10-CM | POA: Diagnosis not present

## 2018-04-19 ENCOUNTER — Other Ambulatory Visit: Payer: Self-pay | Admitting: Family

## 2018-06-29 DIAGNOSIS — Z3202 Encounter for pregnancy test, result negative: Secondary | ICD-10-CM | POA: Diagnosis not present

## 2018-06-29 DIAGNOSIS — Z124 Encounter for screening for malignant neoplasm of cervix: Secondary | ICD-10-CM | POA: Diagnosis not present

## 2018-06-29 DIAGNOSIS — Z6826 Body mass index (BMI) 26.0-26.9, adult: Secondary | ICD-10-CM | POA: Diagnosis not present

## 2018-06-29 DIAGNOSIS — Z113 Encounter for screening for infections with a predominantly sexual mode of transmission: Secondary | ICD-10-CM | POA: Diagnosis not present

## 2018-06-29 DIAGNOSIS — Z01419 Encounter for gynecological examination (general) (routine) without abnormal findings: Secondary | ICD-10-CM | POA: Diagnosis not present

## 2018-07-16 DIAGNOSIS — R5383 Other fatigue: Secondary | ICD-10-CM | POA: Diagnosis not present

## 2018-12-12 ENCOUNTER — Encounter: Payer: Self-pay | Admitting: Family

## 2018-12-13 NOTE — Telephone Encounter (Signed)
Please contact pt to schedule OV for poison oak and refills. I have not seen her since 12/18.

## 2019-03-26 ENCOUNTER — Other Ambulatory Visit: Payer: Self-pay

## 2019-03-26 ENCOUNTER — Ambulatory Visit (INDEPENDENT_AMBULATORY_CARE_PROVIDER_SITE_OTHER): Payer: BC Managed Care – PPO | Admitting: Physical Therapy

## 2019-03-26 DIAGNOSIS — M25552 Pain in left hip: Secondary | ICD-10-CM | POA: Diagnosis not present

## 2019-03-28 NOTE — Patient Instructions (Signed)
Access Code: [email or web address]  URL: [email or web address]  Date: 03/26/2019  Prepared by: Lyndee Hensen   Exercises Seated Hamstring Stretch - 3 reps - 30 hold - 2x daily Supine Hamstring Stretch with Strap - 3 reps - 30 hold - 2x daily Side Lunge Adductor Stretch - 3 reps - 30 hold - 2x daily Long Sitting Hip Adductor Stretch - 3 reps - 30 hold - 2x daily Seated Figure 4 Piriformis Stretch - 3 reps - 30 hold - 2x daily

## 2019-04-01 ENCOUNTER — Encounter: Payer: Self-pay | Admitting: Physical Therapy

## 2019-04-01 NOTE — Therapy (Addendum)
[facility] [address] [place], [place], [address] Phone: [phone]   Fax:  [phone]  Physical Therapy Evaluation  Patient Details  Name: Kristin Sandoval MRN: [id number] Date of Birth: Oct 24, 1985 Referring Provider (PT): Linna Darner   Encounter Date: 03/26/2019  PT End of Session - 04/01/19 0934    Visit Number  1    Number of Visits  12    Date for PT Re-Evaluation  05/07/19    Authorization Type  BCBS    PT Start Time  1435    PT Stop Time  1515    PT Time Calculation (min)  40 min    Activity Tolerance  Patient tolerated treatment well    Behavior During Therapy  [facility] for tasks assessed/performed       Past Medical History:  Diagnosis Date  . ADHD (attention deficit hyperactivity disorder)    Adderall used  . Asthma    childhood  . Complication of anesthesia    some difficulty awakening  . Headache    migraines in past- none recent  . Skin cancer    basal cell carcinoma    Past Surgical History:  Procedure Laterality Date  . ADENOIDECTOMY     33 yrs old  . BREAST ENHANCEMENT SURGERY Bilateral 2014  . COLONOSCOPY WITH PROPOFOL N/A 03/21/2016   Procedure: COLONOSCOPY WITH PROPOFOL;  Surgeon: Leighton Ruff, MD;  Location: [facility];  Service: Endoscopy;  Laterality: N/A;  . TONSILLECTOMY  4/15   due to tonsil stones    There were no vitals filed for this visit.   Subjective Assessment - 04/01/19 0932    Subjective  Pt states L hip pain for about 5 months. May have started when she stepped over a fence and fell. Then also noticed pain when doing a stretch with L Leg. Working from home, is getting up and down frequently .    Limitations  Sitting;Standing;House hold activities    Patient Stated Goals  decreased pain    Currently in Pain?  Yes    Pain Location  Hip    Pain Orientation  Left    Pain Descriptors / Indicators  Radiating;Aching    Pain Type  Acute pain    Pain Onset  More than a month ago     Pain Frequency  Intermittent    Aggravating Factors   sitting, standing, walking    Pain Relieving Factors  none         [facility] PT Assessment - 04/01/19 0001      Assessment   Medical Diagnosis  L hip pain    Referring Provider (PT)  Linna Darner    Prior Therapy  No      Balance Screen   Has the patient fallen in the past 6 months  No      Prior Function   Level of Independence  Independent      Cognition   Overall Cognitive Status  Within Functional Limits for tasks assessed      ROM / Strength   AROM / PROM / Strength  AROM;Strength      AROM   Overall AROM Comments  Lumbar: WNL,  Hips: WNL,  Knee: WNL /  Hypermobile       Strength   Overall Strength Comments  Hips: 4+/5;  Knee 5/5;        Palpation   Palpation comment  Most tenderness concentrated at L Ishial tuberosisy, Mild soreness at L adductor insertion .  Special Tests   Other special tests  Neg Fabir/Fadir, Neg SLR,       Ambulation/Gait   Gait Comments  unremarkable                Objective measurements completed on examination: See above findings.      [facility] Adult PT Treatment/Exercise - 04/01/19 0001      Exercises   Exercises  Knee/Hip      Knee/Hip Exercises: Stretches   Active Hamstring Stretch  2 reps;20 seconds    Active Hamstring Stretch Limitations  supine with strap and seated, then x10 with ankle pump x10 in supine for nerve glide     Piriformis Stretch  2 reps;30 seconds    Piriformis Stretch Limitations  seated    Other Knee/Hip Stretches  HIp adductor standing lunge 10 sec x3;  Hip adductor seated V 30 sec x2             PT Education - 04/01/19 0933    Education Details  PT POC, HEP, Exam findings    Person(s) Educated  Patient    Methods  Explanation;Demonstration;Tactile cues;Verbal cues;Handout    Comprehension  Verbalized understanding;Returned demonstration;Verbal cues required;Tactile cues required       PT Short Term Goals - 04/01/19 0939       PT SHORT TERM GOAL #1   Title  Pt to be independent with initial HEP    Time  2    Period  Weeks    Status  New    Target Date  04/09/19        PT Long Term Goals - 04/01/19 0939      PT LONG TERM GOAL #1   Title  Pt to be independent with final HEP    Time  6    Period  Weeks    Status  New    Target Date  05/07/19      PT LONG TERM GOAL #2   Title  Pt to report decreased pain in L hip/Ishial tuberosity to 0-2/10    Time  6    Period  Weeks    Status  New    Target Date  05/07/19      PT LONG TERM GOAL #3   Title  Pt to report ability to sit, for up to 30 min, with pain 0-2/10 in ischial tuberosity/glute region    Time  6    Period  Weeks    Status  New    Target Date  05/07/19             Plan - 04/01/19 0941    Clinical Impression Statement  Pt presents with primary complaint of increased pain in L glute. Pt with most tenderness at ishial tuberosity region, likely bursitis, and pain at proximal hip adductors as well. Pt with mild tightness in L HS vs R. Pt with decreased ability for full functional activities, sitting, work duties,and IADLS due to pain. Pt to benefit from skilled PT to improve deficits and pain. Pt states that she may not be able to return for future visits, due to needing to leave to go to 30 day treatment center, but may be interested in doing virtual visit if able. Will leave chart open for a few weeks to see if pt is able to return for PT.    Examination-Activity Limitations  Sit;Squat;Stand    Examination-Participation Restrictions  Cleaning;Community Activity;Driving    Stability/Clinical Decision Making  Stable/Uncomplicated  Clinical Decision Making  Low    Rehab Potential  Good    PT Frequency  2x / week    PT Duration  6 weeks    PT Treatment/Interventions  ADLs/Self Care Home Management;Cryotherapy;Electrical Stimulation;Iontophoresis 19m/ml Dexamethasone;Moist Heat;Traction;Therapeutic exercise;Therapeutic activities;Functional  mobility training;Stair training;Gait training;Ultrasound;Neuromuscular re-education;Patient/family education;Orthotic Fit/Training;Manual techniques;Taping;Dry needling;Passive range of motion;Spinal Manipulations;Joint Manipulations    Consulted and Agree with Plan of Care  Patient       Patient will benefit from skilled therapeutic intervention in order to improve the following deficits and impairments:  Decreased activity tolerance, Pain, Increased muscle spasms, Decreased mobility  Visit Diagnosis: Pain in left hip     Problem List Patient Active Problem List   Diagnosis Date Noted  . Amenorrhea 10/27/2014  . Environmental allergies 07/02/2014  . Food allergy 05/10/2013  . Weight gain 10/16/2012  . Routine general medical examination at a health care facility 05/09/2012  . Hemorrhoids 04/10/2012  . ADHD (attention deficit hyperactivity disorder) 09/20/2011  . Depression 07/01/2011    LLyndee Hensen PT, DPT 9:47 AM  04/01/19     [facility] [address] [place] [address]Phone: [phone]  Fax:  [phone] Name: WSHARRY BEININGMRN: [id number] of Birth: [birth year]  PHYSICAL THERAPY DISCHARGE SUMMARY  Visits from Start of Care:1  Plan: Patient agrees to discharge.  Patient goals were not met. Patient is being discharged due to not returning since the last visit.  ?????     Pt did not return to PT  LLyndee Hensen PT, DPT 8:15 AM  07/09/19

## 2019-05-31 DIAGNOSIS — Z20828 Contact with and (suspected) exposure to other viral communicable diseases: Secondary | ICD-10-CM | POA: Diagnosis not present

## 2019-06-05 DIAGNOSIS — F1020 Alcohol dependence, uncomplicated: Secondary | ICD-10-CM | POA: Diagnosis not present

## 2019-06-05 DIAGNOSIS — Z1321 Encounter for screening for nutritional disorder: Secondary | ICD-10-CM | POA: Diagnosis not present

## 2019-06-05 DIAGNOSIS — Z1329 Encounter for screening for other suspected endocrine disorder: Secondary | ICD-10-CM | POA: Diagnosis not present

## 2019-06-05 DIAGNOSIS — Z130 Encounter for screening for diseases of the blood and blood-forming organs and certain disorders involving the immune mechanism: Secondary | ICD-10-CM | POA: Diagnosis not present

## 2019-06-05 DIAGNOSIS — Z114 Encounter for screening for human immunodeficiency virus [HIV]: Secondary | ICD-10-CM | POA: Diagnosis not present

## 2019-06-05 DIAGNOSIS — Z0001 Encounter for general adult medical examination with abnormal findings: Secondary | ICD-10-CM | POA: Diagnosis not present

## 2019-07-16 ENCOUNTER — Ambulatory Visit (INDEPENDENT_AMBULATORY_CARE_PROVIDER_SITE_OTHER): Payer: BC Managed Care – PPO | Admitting: Addiction (Substance Use Disorder)

## 2019-07-16 ENCOUNTER — Other Ambulatory Visit: Payer: Self-pay

## 2019-07-16 DIAGNOSIS — F411 Generalized anxiety disorder: Secondary | ICD-10-CM | POA: Diagnosis not present

## 2019-07-16 DIAGNOSIS — F4310 Post-traumatic stress disorder, unspecified: Secondary | ICD-10-CM | POA: Diagnosis not present

## 2019-07-16 DIAGNOSIS — F331 Major depressive disorder, recurrent, moderate: Secondary | ICD-10-CM | POA: Diagnosis not present
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
## 2019-07-16 NOTE — Addendum Note (Signed)
Addended by: Barnie Del on: 07/16/2019 04:04 PM   Modules accepted: Level of Service

## 2019-07-23 ENCOUNTER — Other Ambulatory Visit: Payer: Self-pay

## 2019-07-23 ENCOUNTER — Ambulatory Visit (INDEPENDENT_AMBULATORY_CARE_PROVIDER_SITE_OTHER): Payer: BC Managed Care – PPO | Admitting: Addiction (Substance Use Disorder)

## 2019-07-23 ENCOUNTER — Encounter: Payer: Self-pay | Admitting: Addiction (Substance Use Disorder)

## 2019-07-23 DIAGNOSIS — F411 Generalized anxiety disorder: Secondary | ICD-10-CM | POA: Diagnosis not present

## 2019-07-23 DIAGNOSIS — F4310 Post-traumatic stress disorder, unspecified: Secondary | ICD-10-CM
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
## 2019-07-24 ENCOUNTER — Encounter: Payer: Self-pay | Admitting: Adult Health

## 2019-07-24 ENCOUNTER — Ambulatory Visit (INDEPENDENT_AMBULATORY_CARE_PROVIDER_SITE_OTHER): Payer: BC Managed Care – PPO | Admitting: Adult Health

## 2019-07-24 DIAGNOSIS — F331 Major depressive disorder, recurrent, moderate: Secondary | ICD-10-CM

## 2019-07-24 DIAGNOSIS — F411 Generalized anxiety disorder: Secondary | ICD-10-CM

## 2019-07-24 DIAGNOSIS — F4310 Post-traumatic stress disorder, unspecified: Secondary | ICD-10-CM

## 2019-07-24 DIAGNOSIS — F1010 Alcohol abuse, uncomplicated: Secondary | ICD-10-CM | POA: Diagnosis not present

## 2019-07-24 DIAGNOSIS — G4700 Insomnia, unspecified: Secondary | ICD-10-CM | POA: Diagnosis not present

## 2019-07-24 DIAGNOSIS — Z85828 Personal history of other malignant neoplasm of skin: Secondary | ICD-10-CM | POA: Insufficient documentation

## 2019-07-24 DIAGNOSIS — Z609 Problem related to social environment, unspecified: Secondary | ICD-10-CM | POA: Insufficient documentation

## 2019-07-24 DIAGNOSIS — N871 Moderate cervical dysplasia: Secondary | ICD-10-CM | POA: Insufficient documentation

## 2019-07-24 MED ORDER — FLUOXETINE HCL 10 MG PO CAPS: 10.0000 mg | ORAL_CAPSULE | Freq: Two times a day (BID) | ORAL | 2 refills | Status: DC

## 2019-07-24 MED ORDER — DOXEPIN HCL 10 MG PO CAPS: 10.0000 mg | ORAL_CAPSULE | Freq: Every day | ORAL | 2 refills | Status: DC

## 2019-07-24 MED ORDER — QUETIAPINE FUMARATE 25 MG PO TABS: 25.0000 mg | ORAL_TABLET | Freq: Every day | ORAL | 2 refills | Status: DC

## 2019-07-24 NOTE — Progress Notes (Signed)
[facility] MD/PA/NP Initial Note  07/24/2019 1:07 PM Kristin Sandoval  MRN:  [id number]  Chief Complaint:    Referred by Sammuel Cooper - therapist  HPI: Describes mood today as "ok". Pleasant. Tearful at times. Mood symptoms - reports depression, anxiety, and irritability. Stating "I'm doing alright". Was referred by therapist for medication review/mamagement. Recently in rehabilitation center in [place] for alcohol abuse after having a relapse in August. Finished program successfully. Also spent time attending a program in [place] to get reacclamiated to living a healthier lifestyle. Was able to take FMLA from work during this time. Has returned to work and has interviewed for a promotion. Feels like things are going better for her. Stating "I feel more hopeful. Stable interest and motivation. Taking medications as prescribed.  Energy levels stable. Active, does not have a regular exercise routine. Works full-time.   Enjoys some usual interests and activities. Single. Lives alone. Has a "mini farm". Talking to friends.  Appetite adequate. Worked with a nutritionist while in rehab. Weight gain "a little". Sleeps well most nights. Averages 7 hours. Focus and concentration stable. Completing tasks. Managing aspects of household. Works full time at [employer]. Denies SI or HI. Denies AH or VH.  Previous medication trials: Unknown  Visit Diagnosis: No diagnosis found.  Past Psychiatric History: Denies psychiatric hospitalization. Admitted to alcohol rehabilitation in [place].   Past Medical History:  Past Medical History:  Diagnosis Date  . ADHD (attention deficit hyperactivity disorder)    Adderall used  . Asthma    childhood  . Complication of anesthesia    some difficulty awakening  . Headache    migraines in past- none recent  . Skin cancer    basal cell carcinoma    Past Surgical History:  Procedure Laterality Date  . ADENOIDECTOMY     34 yrs old  . BREAST ENHANCEMENT SURGERY  Bilateral 2014  . COLONOSCOPY WITH PROPOFOL N/A 03/21/2016   Procedure: COLONOSCOPY WITH PROPOFOL;  Surgeon: Leighton Ruff, MD;  Location: [facility];  Service: Endoscopy;  Laterality: N/A;  . TONSILLECTOMY  4/15   due to tonsil stones    Family Psychiatric History: Denies any family history of mental illness. Father alcoholic.  Family History:  Family History  Problem Relation Age of Onset  . Alcohol abuse Father   . Arthritis Maternal Uncle   . Hyperlipidemia Maternal Uncle   . Alcohol abuse Paternal Aunt   . Arthritis Maternal Grandmother        DDD, spinal stenosis  . Hyperlipidemia Maternal Grandmother   . Alcohol abuse Maternal Grandfather   . Alcohol abuse Paternal Grandfather     Social History:  Social History   Socioeconomic History  . Marital status: Single    Spouse name: Not on file  . Number of children: 0  . Years of education: Not on file  . Highest education level: Not on file  Occupational History    Employer: [employer]  Tobacco Use  . Smoking status: Former Smoker    Types: Cigarettes    Quit date: 06/30/2006    Years since quitting: 13.0  . Smokeless tobacco: Never Used  Substance and Sexual Activity  . Alcohol use: Yes    Comment: 1-3 drinks weekly  . Drug use: No  . Sexual activity: Yes    Birth control/protection: Pill  Other Topics Concern  . Not on file  Social History Narrative   Caffeine Use:  1 cup coffee daily   Regular exercise:  4 x  weekly         Social Determinants of Health   Financial Resource Strain:   . Difficulty of Paying Living Expenses: Not on file  Food Insecurity:   . Worried About Charity fundraiser in the Last Year: Not on file  . Ran Out of Food in the Last Year: Not on file  Transportation Needs:   . Lack of Transportation (Medical): Not on file  . Lack of Transportation (Non-Medical): Not on file  Physical Activity:   . Days of Exercise per Week: Not on file  . Minutes of Exercise per Session: Not on  file  Stress:   . Feeling of Stress : Not on file  Social Connections:   . Frequency of Communication with Friends and Family: Not on file  . Frequency of Social Gatherings with Friends and Family: Not on file  . Attends Religious Services: Not on file  . Active Member of Clubs or Organizations: Not on file  . Attends Archivist Meetings: Not on file  . Marital Status: Not on file    Allergies:  Allergies  Allergen Reactions  . Apple Shortness Of Breath  . Acrylic Polymer [Carbomer]     Loses nail  . Latex     Latex "mild skin irritation"  . Other Other (See Comments)    STRAW / HAY.  SEVERE  ITCHING, CONGESTION.  . Adhesive [Tape] Rash  . Mango Flavor Rash    Metabolic Disorder Labs: No results found for: HGBA1C, MPG No results found for: PROLACTIN Lab Results  Component Value Date   CHOL 163 03/22/2017   TRIG 87.0 03/22/2017   HDL 54.50 03/22/2017   CHOLHDL 3 03/22/2017   VLDL 17.4 03/22/2017   LDLCALC 91 03/22/2017   LDLCALC 106 (H) 03/04/2016   Lab Results  Component Value Date   TSH 0.72 03/22/2017   TSH 0.78 11/29/2016    Therapeutic Level Labs: No results found for: LITHIUM No results found for: VALPROATE No components found for:  CBMZ  Current Medications: Current Outpatient Medications  Medication Sig Dispense Refill  . albuterol (PROAIR HFA) 108 (90 Base) MCG/ACT inhaler INHALE 2 PUFFS INTO THE LUNGS EVERY 6 (SIX) HOURS AS NEEDED FOR WHEEZING OR SHORTNESS OF BREATH. 8.5 Inhaler 0  . amphetamine-dextroamphetamine (ADDERALL) 10 MG tablet Take 1 tablet (10 mg total) by mouth 2 (two) times daily with a meal. 60 tablet 0  . ibuprofen (ADVIL,MOTRIN) 600 MG tablet 1 tab po q8h prn at onset of headache 30 tablet 3  . nitrofurantoin, macrocrystal-monohydrate, (MACROBID) 100 MG capsule TAKE 1 TABLET BY MOUTH ONCE AFTER INTERCOURSE TO PREVENT UTI 30 capsule 0  . tretinoin (RETIN-A) 0.05 % cream Apply topically at bedtime. 45 g 0  . valACYclovir  (VALTREX) 500 MG tablet TAKE 1 TABLET (500 MG TOTAL) BY MOUTH DAILY AS NEEDED. 30 tablet 11  . venlafaxine XR (EFFEXOR XR) 150 MG 24 hr capsule Take 1 capsule (150 mg total) by mouth daily with breakfast. 30 capsule 0   No current facility-administered medications for this visit.    Medication Side Effects: none  Orders placed this visit:  No orders of the defined types were placed in this encounter.   Psychiatric Specialty Exam:  Review of Systems  There were no vitals taken for this visit.There is no height or weight on file to calculate BMI.  General Appearance: Neat and Well Groomed  Eye Contact:  Good  Speech:  Clear and Coherent and Normal Rate  Volume:  Normal  Mood:  Euthymic  Affect:  Congruent  Thought Process:  Coherent and Descriptions of Associations: Intact  Orientation:  Full (Time, Place, and Person)  Thought Content: Logical   Suicidal Thoughts:  No  Homicidal Thoughts:  No  Memory:  WNL  Judgement:  Good  Insight:  Good  Psychomotor Activity:  Normal  Concentration:  Concentration: Good  Recall:  Good  Fund of Knowledge: Good  Language: Good  Assets:  Communication Skills Desire for Improvement Financial Resources/Insurance Housing Intimacy Leisure Time Physical Health Resilience Social Support Talents/Skills Transportation Vocational/Educational  ADL's:  Intact  Cognition: WNL  Prognosis:  Good   Screenings:  PHQ2-9     Office Visit from 03/22/2017 in [facility] at [place] Visit from 03/04/2016 in [facility] at [place]  PHQ-2 Total Score  3  0  PHQ-9 Total Score  10  --      Receiving Psychotherapy: Yes Sammuel Cooper  Diagnosis:   ICD-10-CM   1. Insomnia, unspecified type  G47.00 FLUoxetine (PROZAC) 10 MG capsule    QUEtiapine (SEROQUEL) 25 MG tablet    doxepin (SINEQUAN) 10 MG capsule  2. Major depressive disorder, recurrent episode, moderate ([facility])  F33.1 FLUoxetine  (PROZAC) 10 MG capsule  3. Generalized anxiety disorder  F41.1 FLUoxetine (PROZAC) 10 MG capsule  4. Alcohol abuse  F10.10   5. PTSD (post-traumatic stress disorder)  F43.10 FLUoxetine (PROZAC) 10 MG capsule  Treatment Plan/Recommendations:   Plan:  PDMP reviewed  1. Wellbutrin XL 150mg  - started January 5 - denies seizure history 2. Doxepin 10mg  at hs 3. Seroquel 25mg  at hs 4. Hydroxyzine 25/50 5. Add Prozac 10mg  daily.   RTC 4 weeks  Patient advised to contact office with any questions, adverse effects, or acute worsening in signs and symptoms.  Discussed potential metabolic side effects associated with atypical antipsychotics, as well as potential risk for movement side effects. Advised pt to contact office if movement side effects occur.    Aloha Gell, NP

## 2019-07-30 ENCOUNTER — Ambulatory Visit (INDEPENDENT_AMBULATORY_CARE_PROVIDER_SITE_OTHER): Payer: BC Managed Care – PPO | Admitting: Addiction (Substance Use Disorder)

## 2019-07-30 ENCOUNTER — Other Ambulatory Visit: Payer: Self-pay

## 2019-07-30 ENCOUNTER — Encounter: Payer: Self-pay | Admitting: Addiction (Substance Use Disorder)

## 2019-07-30 DIAGNOSIS — F4310 Post-traumatic stress disorder, unspecified: Secondary | ICD-10-CM

## 2019-07-30 DIAGNOSIS — F411 Generalized anxiety disorder: Secondary | ICD-10-CM | POA: Diagnosis not present

## 2019-07-30 MED ORDER — BUPROPION HCL ER (XL) 150 MG PO TB24: 150.0000 mg | ORAL_TABLET | Freq: Every day | ORAL | 1 refills | Status: DC
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
## 2019-07-30 NOTE — Addendum Note (Signed)
Addended by: Aloha Gell on: 07/30/2019 02:35 PM   Modules accepted: Orders

## 2019-08-13 ENCOUNTER — Ambulatory Visit (INDEPENDENT_AMBULATORY_CARE_PROVIDER_SITE_OTHER): Payer: BC Managed Care – PPO | Admitting: Addiction (Substance Use Disorder)

## 2019-08-13 ENCOUNTER — Other Ambulatory Visit: Payer: Self-pay

## 2019-08-13 DIAGNOSIS — F411 Generalized anxiety disorder: Secondary | ICD-10-CM | POA: Diagnosis not present

## 2019-08-13 DIAGNOSIS — F4310 Post-traumatic stress disorder, unspecified: Secondary | ICD-10-CM | POA: Diagnosis not present

## 2019-08-13 NOTE — Progress Notes (Signed)
[facility] Counselor/Therapist Progress Note  Patient ID: Kristin Sandoval, MRN: [id number],    Date: 08/13/2019  Time Spent: 10:07-11:05 58 mins  Treatment Type: Individual Therapy  Reported Symptoms: depressed, struggling, worthless/hopeless thoughts, crying spells.   Mental Status Exam:  Appearance:   Casual     Behavior:  Appropriate  Motor:  Restlestness  Speech/Language:   Normal Rate  Affect:  Congruent and Full Range  Mood:  constricted, depressed, dysthymic and sad  Thought process:  circumstantial and goal directed  Thought content:    Obsessions and Rumination  Sensory/Perceptual disturbances:    Flashback  Orientation:  x4  Attention:  Good  Concentration:  Fair  Memory:  WNL  Fund of knowledge:   Good  Insight:    Good  Judgment:   Good  Impulse Control:  Good   Risk Assessment: Danger to Self:  No Self-injurious Behavior: No Danger to Others: No Duty to Warn:no Physical Aggression / Violence:No  Access to Firearms a concern: No  Gang Involvement:No   Subjective: Client came in showing a lot of fidgeting/restlessness and hand writhing. Client began to process her continued hyperarousal and hypervigilence experiences right now that are still causes hopelessness that she will not get better. Client reported feeling: depressed, worthless/hopeless thoughts, and having crying spells. Therapist inquired about her self-care/ coping skills she is using to help her ground emotionally. Client processed a desire to get a horse, but having a financial struggles where she dont think she can. She processed a concern of not going to her friends to be social if she had her own horse. Therapist used RPT further with client to discuss her recovery needs with her PTSD hypervigilence. 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 Therapist provided psychoeducation around trauma and  window of tolerance and bottom up emotion regulation techniques.   Interventions: Cognitive Behavioral Therapy, Dialectical Behavioral Therapy, Motivational Interviewing and Psycho-education/Bibliotherapy  Diagnosis:   ICD-10-CM   1. PTSD (post-traumatic stress disorder)  F43.10   2. Generalized anxiety disorder  F41.1    Plan of Care: Client to return for weekly therapy with Sammuel Cooper, therapist, for outpatient therapy, to review again in 3-6 months. Client is to CONSIDER seeing medication provider for support of mood management, triggers and cravings.   Client to continue engaging in more support for them in their SUD txt: engaging in 12 step meetings. Client to engage in [place] txt and RPT relapse prevention therapy AEB coming to therapy weekly and implementing recovery oriented coping strategies to help reduce use of substances and to find relief from symptoms of MH disorders and trauma that cause them to want to escape from reality and numb their mind&body.  Client also to create more stability & structure AEB goal planning with therapist to assist in helping them get into a healthy rhythm/ schedule that can help to calm their nervous system and begin to build more healthy brain neuropathways.  Client to engage in CBT: challenging negative internal ruminationsand self-talk AEB expressing toxic thoughts and challenging them with truth.  Client to practice DBT distress tolerance skills (such as distress tolerance and emotion regulation) to practice achieving wise mind and to build support for dealing with cravings AEB learning to ride the wave of emotion/sensation/etc instead of seeking negative self-soothing techniques: ie using substances to numb self.  Client to utilize BSP (brainspotting) with therapist to help client identify  and process triggers for their MH/ SUD with goal of reducing SUDs by 33% each session. Client to prioritize sleep 8+ hours each week night AEB going to bed by 10pm  each night. Client participated in the treatment planning of their therapy. Client agreed with the plan and understands what to do if there is a crisis: call 9-1-1 and/or crisis line given by therapist.  Barnie Del, LCSW, LCAS, CCTP, CCS-I, BSP

## 2019-08-21 ENCOUNTER — Encounter: Payer: Self-pay | Admitting: Adult Health

## 2019-08-21 ENCOUNTER — Other Ambulatory Visit: Payer: Self-pay

## 2019-08-21 ENCOUNTER — Ambulatory Visit (INDEPENDENT_AMBULATORY_CARE_PROVIDER_SITE_OTHER): Payer: BC Managed Care – PPO | Admitting: Adult Health

## 2019-08-21 DIAGNOSIS — F331 Major depressive disorder, recurrent, moderate: Secondary | ICD-10-CM | POA: Diagnosis not present

## 2019-08-21 DIAGNOSIS — F4310 Post-traumatic stress disorder, unspecified: Secondary | ICD-10-CM

## 2019-08-21 DIAGNOSIS — F1010 Alcohol abuse, uncomplicated: Secondary | ICD-10-CM

## 2019-08-21 DIAGNOSIS — G4700 Insomnia, unspecified: Secondary | ICD-10-CM | POA: Diagnosis not present

## 2019-08-21 DIAGNOSIS — F411 Generalized anxiety disorder: Secondary | ICD-10-CM

## 2019-08-21 DIAGNOSIS — F418 Other specified anxiety disorders: Secondary | ICD-10-CM

## 2019-08-21 MED ORDER — QUETIAPINE FUMARATE 25 MG PO TABS: ORAL_TABLET | ORAL | 5 refills | Status: DC

## 2019-08-21 MED ORDER — FLUOXETINE HCL 40 MG PO CAPS: 40.0000 mg | ORAL_CAPSULE | Freq: Two times a day (BID) | ORAL | 5 refills | Status: DC

## 2019-08-21 MED ORDER — DOXEPIN HCL 10 MG PO CAPS: 10.0000 mg | ORAL_CAPSULE | Freq: Every day | ORAL | 2 refills | Status: DC

## 2019-08-21 MED ORDER — HYDROXYZINE PAMOATE 25 MG PO CAPS: 25.0000 mg | ORAL_CAPSULE | Freq: Three times a day (TID) | ORAL | 5 refills | Status: DC

## 2019-08-21 NOTE — Progress Notes (Signed)
Kristin Sandoval [id number] Nov 08, 1985 34 y.o.  Subjective:   Patient ID:  Kristin Sandoval is a 34 y.o. (DOB 06/07/1986) female.  Chief Complaint: No chief complaint on file.   HPI Kristin Sandoval presents to the office today for follow-up of PTSD, GAD, insomnia, MDD, and PTSD.  Referred by Sammuel Cooper - therapist  Describes mood today as "not the best". Pleasant. Decreased tearfulness - "cried maybe 3 times since last visit". Mood symptoms - reports depression, anxiety, and irritability. Stating "I'm not doing as well as I was". Also stating "things plateaued and now I'm sinking again". Maintains sobriety. Recently in rehabilitation for alcohol abuse. Decreased worry and rumination. Is cycling through her thoughts. A friend noticed she hasn't been worrying as much. Has issues speaking in front of people. Stating "it irritates me". Has been an issue. Recent promotion at work. Stable interest and motivation. Taking medications as prescribed.  Energy levels stable. Active, does not have a regular exercise routine. Getting out in the evening and walking dog. Works full-time - 40 hours.    Enjoys some usual interests and activities. Single. Lives alone[place]. 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 Talking to friends.  Appetite adequate. Weight stable. Sleeps well most nights. Harder to get to sleep. Averages 6 to 8 hours. Focus and concentration stable. Completing tasks. Managing aspects of household. Work going well Pharmacist, community. Denies SI or HI. Denies AH or VH.  Previous medication trials: Wellbutrin - rash.  PHQ2-9     Office Visit from 03/22/2017 in [facility] [place] Visit from 03/04/2016 in [facility] [place] [facility]  PHQ-2 Total Score  3  0  PHQ-9 Total Score  10  --       Review of Systems:  Review of Systems  Musculoskeletal: Negative for gait problem.  Neurological: Negative for tremors.   Psychiatric/Behavioral:       Please refer to HPI    Medications: I have reviewed the patient's current medications.  Current Outpatient Medications  Medication Sig Dispense Refill  . albuterol (PROAIR HFA) 108 (90 Base) MCG/ACT inhaler INHALE 2 PUFFS INTO THE LUNGS EVERY 6 (SIX) HOURS AS NEEDED FOR WHEEZING OR SHORTNESS OF BREATH. 8.5 Inhaler 0  . amphetamine-dextroamphetamine (ADDERALL XR) 20 MG 24 hr capsule dextroamphetamine-amphetamine ER 20 mg 24hr capsule,extend release    . amphetamine-dextroamphetamine (ADDERALL) 5 MG tablet SMARTSIG:0.5 Tablet(s) By Mouth As Needed    . buPROPion (WELLBUTRIN XL) 150 MG 24 hr tablet Take 1 tablet (150 mg total) by mouth daily. 30 tablet 1  . Cholecalciferol (VITAMIN D3) 1.25 MG (50000 UT) CAPS SMARTSIG:1 Capsule(s) By Mouth Once a Week PRN    . doxepin (SINEQUAN) 10 MG capsule Take 1 capsule (10 mg total) by mouth at bedtime. 30 capsule 2  . FLUoxetine (PROZAC) 10 MG capsule Take 1 capsule (10 mg total) by mouth 2 (two) times daily. 60 capsule 2  . hydrOXYzine (VISTARIL) 25 MG capsule SMARTSIG:2 Capsule(s) By Mouth Every 6 Hours PRN    . ibuprofen (ADVIL,MOTRIN) 600 MG tablet 1 tab po q8h prn at onset of headache 30 tablet 3  . metroNIDAZOLE (METROCREAM) 0.75 % cream APPLY TO AFFECTED AREA EVERY DAY AS NEEDED    . QUEtiapine (SEROQUEL) 25 MG tablet Take 1 tablet (25 mg total) by mouth at bedtime. 30 tablet 2  . tretinoin (RETIN-A) 0.025 % cream APPLY TO AFFECTED AREA EVERY DAY AT BEDTIME    .  tretinoin (RETIN-A) 0.05 % cream Apply topically at bedtime. 45 g 0  . valACYclovir (VALTREX) 1000 MG tablet      No current facility-administered medications for this visit.    Medication Side Effects: None  Allergies:  Allergies  Allergen Reactions  . Apple Shortness Of Breath  . Acrylic Polymer [Carbomer]     Loses nail  . Latex     Latex "mild skin irritation"  . Other Other (See Comments)    STRAW / HAY.  SEVERE  ITCHING, CONGESTION.  .  Adhesive [Tape] Rash  . Mango Flavor Rash    Past Medical History:  Diagnosis Date  . ADHD (attention deficit hyperactivity disorder)    Adderall used  . Asthma    childhood  . Complication of anesthesia    some difficulty awakening  . Headache    migraines in past- none recent  . Skin cancer    basal cell carcinoma    Family History  Problem Relation Age of Onset  . Alcohol abuse Father   . Arthritis Maternal Uncle   . Hyperlipidemia Maternal Uncle   . Alcohol abuse Paternal Aunt   . Arthritis Maternal Grandmother        DDD, spinal stenosis  . Hyperlipidemia Maternal Grandmother   . Alcohol abuse Maternal Grandfather   . Alcohol abuse Paternal Grandfather     Social History   Socioeconomic History  . Marital status: Single    Spouse name: Not on file  . Number of children: 0  . Years of education: Not on file  . Highest education level: Not on file  Occupational History    Employer: [employer]  Tobacco Use  . Smoking status: Former Smoker    Types: Cigarettes    Quit date: 06/30/2006    Years since quitting: 13.1  . Smokeless tobacco: Never Used  Substance and Sexual Activity  . Alcohol use: Yes    Comment: 1-3 drinks weekly  . Drug use: No  . Sexual activity: Yes    Birth control/protection: Pill  Other Topics Concern  . Not on file  Social History Narrative   Caffeine Use:  1 cup coffee daily   Regular exercise:  4 x weekly         Social Determinants of Health   Financial Resource Strain:   . Difficulty of Paying Living Expenses: Not on file  Food Insecurity:   . Worried About Charity fundraiser in the Last Year: Not on file  . Ran Out of Food in the Last Year: Not on file  Transportation Needs:   . Lack of Transportation (Medical): Not on file  . Lack of Transportation (Non-Medical): Not on file  Physical Activity:   . Days of Exercise per Week: Not on file  . Minutes of Exercise per Session: Not on file  Stress:   . Feeling of Stress :  Not on file  Social Connections:   . Frequency of Communication with Friends and Family: Not on file  . Frequency of Social Gatherings with Friends and Family: Not on file  . Attends Religious Services: Not on file  . Active Member of Clubs or Organizations: Not on file  . Attends Archivist Meetings: Not on file  . Marital Status: Not on file  Intimate Partner Violence:   . Fear of Current or Ex-Partner: Not on file  . Emotionally Abused: Not on file  . Physically Abused: Not on file  . Sexually Abused: Not on file  Past Medical History, Surgical history, Social history, and Family history were reviewed and updated as appropriate.   Please see review of systems for further details on the patient's review from today.   Objective:   Physical Exam:  There were no vitals taken for this visit.  Physical Exam Constitutional:      General: She is not in acute distress. Musculoskeletal:        General: No deformity.  Neurological:     Mental Status: She is alert and oriented to person, place, and time.     Coordination: Coordination normal.  Psychiatric:        Attention and Perception: Attention and perception normal. She does not perceive auditory or visual hallucinations.        Mood and Affect: Mood is anxious and depressed. Affect is not labile, blunt, angry or inappropriate.        Speech: Speech normal.        Behavior: Behavior normal.        Thought Content: Thought content normal. Thought content is not paranoid or delusional. Thought content does not include homicidal or suicidal ideation. Thought content does not include homicidal or suicidal plan.        Cognition and Memory: Cognition and memory normal.        Judgment: Judgment normal.     Comments: Insight intact     Lab Review:     Component Value Date/Time   NA 135 06/09/2017 1135   K 4.0 06/09/2017 1135   CL 101 06/09/2017 1135   CO2 28 06/09/2017 1135   GLUCOSE 83 06/09/2017 1135   BUN 12  06/09/2017 1135   CREATININE 0.88 06/09/2017 1135   CREATININE 0.80 05/10/2013 1451   CALCIUM 8.8 06/09/2017 1135   PROT 7.9 03/22/2017 1253   ALBUMIN 4.6 03/22/2017 1253   AST 10 03/22/2017 1253   ALT 10 03/22/2017 1253   ALKPHOS 55 03/22/2017 1253   BILITOT 0.7 03/22/2017 1253   GFRNONAA >89 05/10/2013 1451   GFRAA >89 05/10/2013 1451       Component Value Date/Time   WBC 7.0 03/22/2017 1253   RBC 4.58 03/22/2017 1253   HGB 13.8 03/22/2017 1253   HCT 41.5 03/22/2017 1253   PLT 276.0 03/22/2017 1253   MCV 90.7 03/22/2017 1253   MCH 29.5 11/29/2016 1622   MCHC 33.2 03/22/2017 1253   RDW 13.5 03/22/2017 1253   LYMPHSABS 2.3 03/22/2017 1253   MONOABS 0.6 03/22/2017 1253   EOSABS 0.1 03/22/2017 1253   BASOSABS 0.0 03/22/2017 1253    No results found for: POCLITH, LITHIUM   No results found for: PHENYTOIN, PHENOBARB, VALPROATE, CBMZ   .res Assessment: Plan:   Plan:  PDMP reviewed  1. D/C Wellbutrin XL 150mg  - potential rash 2. Doxepin 10mg  at hs 3. Seroquel 25 to 50mg  at hs 4. Hydroxyzine 25mg  TID 5. Increase Prozac 20mg  to 40mg  daily.  6. Consider Abilify 2mg  daily 7. Consider Propanolol for performance anxiety - work setting  RTC 4 weeks  Patient advised to contact office with any questions, adverse effects, or acute worsening in signs and symptoms.  Discussed potential metabolic side effects associated with atypical antipsychotics, as well as potential risk for movement side effects. Advised pt to contact office if movement side effects occur.   There are no diagnoses linked to this encounter.   Please see After Visit Summary for patient specific instructions.  Future Appointments  Date Time Provider [place]  08/22/2019  9:00  AM Barnie Del, LCSW [facility] None    No orders of the defined types were placed in this encounter.   -------------------------------

## 2019-08-22 ENCOUNTER — Encounter: Payer: Self-pay | Admitting: Addiction (Substance Use Disorder)

## 2019-08-22 ENCOUNTER — Ambulatory Visit (INDEPENDENT_AMBULATORY_CARE_PROVIDER_SITE_OTHER): Payer: BC Managed Care – PPO | Admitting: Addiction (Substance Use Disorder)

## 2019-08-22 DIAGNOSIS — F4310 Post-traumatic stress disorder, unspecified: Secondary | ICD-10-CM

## 2019-08-22 DIAGNOSIS — F331 Major depressive disorder, recurrent, moderate: Secondary | ICD-10-CM | POA: Diagnosis not present
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
## 2019-09-12 ENCOUNTER — Other Ambulatory Visit: Payer: Self-pay

## 2019-09-12 ENCOUNTER — Ambulatory Visit (INDEPENDENT_AMBULATORY_CARE_PROVIDER_SITE_OTHER): Payer: BC Managed Care – PPO | Admitting: Addiction (Substance Use Disorder)

## 2019-09-12 ENCOUNTER — Encounter: Payer: Self-pay | Admitting: Addiction (Substance Use Disorder)

## 2019-09-12 DIAGNOSIS — F4310 Post-traumatic stress disorder, unspecified: Secondary | ICD-10-CM | POA: Diagnosis not present

## 2019-09-12 DIAGNOSIS — F331 Major depressive disorder, recurrent, moderate: Secondary | ICD-10-CM

## 2019-09-12 NOTE — Progress Notes (Signed)
[facility] Counselor/Therapist Progress Note  Patient ID: Kristin Sandoval, MRN: [id number],    Date: 09/12/2019  Time Spent: 11:07-12:02 55 mins  Treatment Type: Individual Therapy  Reported Symptoms: less anxiety and panic attacks, but feeling more depression and frustration, less energy.  Mental Status Exam:  Appearance:   Neat and Well Groomed     Behavior:  Sharing  Motor:  Normal  Speech/Language:   Normal Rate  Affect:  Congruent and Full Range  Mood:  depressed, dysthymic, labile and sad  Thought process:  circumstantial and goal directed  Thought content:    Obsessions and Rumination  Sensory/Perceptual disturbances:    Flashbacks  Orientation:  x4  Attention:  Good  Concentration:  Fair  Memory:  WNL  Fund of knowledge:   Good  Insight:    Good  Judgment:   Good  Impulse Control:  Good   Risk Assessment: Danger to Self:  No Self-injurious Behavior: No Danger to Others: No Duty to Warn:no Physical Aggression / Violence:No  Access to Firearms a concern: No  Gang Involvement:No   Subjective: Client came in expressing her frustration and impatience with feeling better. Client reported less crying spells and anxiety. Client reported no motivation and a lot of withdrawal. Therapist used MI to validate client and provide support. Therapist used supportive counseling around her medication management and encouraged her to see Rollene Fare her provider about her medications. Therapist encouraged client to inquire about a medication genetic test panel to see what medications aren't effective. Client shared the urgency of her finding the most effective antidepressant to help her increased depression, frustration, anhedonia, and isolation. Client did report less anxiety and panic attacks but hopelessness this depression would lift. Client aware of her circumstances with relationships being very triggering for her mental health, and processed her temptations to go back to the  toxic, codependent relationship. Therapist used CBT & mindfulness to help client recognize the wounds and fears she has while also helping to provide affirmations for the client and see her own strengths. Client recognized a need to practice self-compassion and grief work and therapist reminded client of mindfulness exercises that help with practicing self-compassion.   Interventions: Cognitive Behavioral Therapy, Mindfulness Meditation, Motivational Interviewing, Grief Therapy and medication management  Diagnosis:   ICD-10-CM   1. Major depressive disorder, recurrent episode, moderate (HCC)  F33.1   2. PTSD (post-traumatic stress disorder)  F43.10    Plan of Care: Client to return for weekly therapy with Sammuel Cooper, therapist, for outpatient therapy, to review again in 3-6 months. Client is to continue seeing medication provider for support of mood management, triggers and cravings.   Client to continue engaging in more support for them in their SUD txt: engaging in 12 step meetings. Client to engage in [place] txt and RPT relapse prevention therapy AEB coming to therapy weekly and implementing recovery oriented coping strategies to help reduce use of substances and to find relief from symptoms of MH disorders and trauma that cause them to want to escape from reality and numb their mind&body.  Client also to create more stability & structure AEB goal planning with therapist to assist in helping them get into a healthy rhythm/ schedule that can help to calm their nervous system and begin to build more healthy brain neuropathways.  Client to engage in CBT: challenging negative internal ruminationsand self-talk AEB expressing toxic thoughts and challenging them with truth.  Client to practice DBT distress tolerance skills (such as distress  tolerance and emotion regulation) to practice achieving wise mind and to build support for dealing with cravings AEB learning to ride the wave of emotion/sensation/etc  instead of seeking negative self-soothing techniques: ie using substances to numb self.  Client to utilize BSP (brainspotting) with therapist to help client identify and process triggers for their MH/ SUD with goal of reducing SUDs by 33% each session. Client to prioritize sleep 8+ hours each week night AEB going to bed by 10pm each night. Client participated in the treatment planning of their therapy. Client agreed with the plan and understands what to do if there is a crisis: call 9-1-1 and/or crisis line given by therapist.  Barnie Del, LCSW, LCAS, CCTP, CCS-I, BSP

## 2019-09-18 ENCOUNTER — Encounter: Payer: Self-pay | Admitting: Adult Health

## 2019-09-18 ENCOUNTER — Ambulatory Visit (INDEPENDENT_AMBULATORY_CARE_PROVIDER_SITE_OTHER): Payer: BC Managed Care – PPO | Admitting: Adult Health

## 2019-09-18 ENCOUNTER — Other Ambulatory Visit: Payer: Self-pay

## 2019-09-18 DIAGNOSIS — F411 Generalized anxiety disorder: Secondary | ICD-10-CM | POA: Diagnosis not present

## 2019-09-18 DIAGNOSIS — F410 Panic disorder [episodic paroxysmal anxiety] without agoraphobia: Secondary | ICD-10-CM | POA: Diagnosis not present

## 2019-09-18 DIAGNOSIS — F1010 Alcohol abuse, uncomplicated: Secondary | ICD-10-CM | POA: Diagnosis not present

## 2019-09-18 DIAGNOSIS — F331 Major depressive disorder, recurrent, moderate: Secondary | ICD-10-CM | POA: Diagnosis not present

## 2019-09-18 DIAGNOSIS — G4700 Insomnia, unspecified: Secondary | ICD-10-CM | POA: Diagnosis not present

## 2019-09-18 DIAGNOSIS — F900 Attention-deficit hyperactivity disorder, predominantly inattentive type: Secondary | ICD-10-CM | POA: Diagnosis not present

## 2019-09-18 DIAGNOSIS — F909 Attention-deficit hyperactivity disorder, unspecified type: Secondary | ICD-10-CM

## 2019-09-18 DIAGNOSIS — F329 Major depressive disorder, single episode, unspecified: Secondary | ICD-10-CM | POA: Diagnosis not present

## 2019-09-18 MED ORDER — ARIPIPRAZOLE 2 MG PO TABS: 2.0000 mg | ORAL_TABLET | Freq: Every day | ORAL | 2 refills | Status: DC

## 2019-09-18 NOTE — Progress Notes (Signed)
Kristin Sandoval [id number] 1986/02/24 34 y.o.  Subjective:   Patient ID:  Kristin Sandoval is a 34 y.o. (DOB 07/26/1985) female.  Chief Complaint: No chief complaint on file.   HPI Kristin Sandoval presents to the office today for follow-up of PTSD, GAD, insomnia, MDD, and PTSD.  Referred by Sammuel Cooper - therapist  No panic. No anxiety.   Diagnosed with ADHD in 2012. Started on Adderall.   Describes mood today as "better". Pleasant. Decreased tearfulness - "that has leveled off". Mood symptoms - Feels like "anxiety is ok". Has not needed to take Vistaril. Decreased worry. Reports depression and irritability. Stating "I've had a few good days". Has increased Prozac to 80mg  daily and feels it has helped with the anxiety. Stating "that part is under control". Maintains sobriety x 6 months. Inpatient rehabilitation. Stable interest and motivation. Taking medications as prescribed.  Energy levels lower. Active, does not have a regular exercise routine. Works full-time - 40 hours.    Enjoys some usual interests and activities. Single. Lives alone[place]. Talking to co-workers. Interactive. Appetite adequate. Weight gain - size 10. Sleeps well most nights. Averages 6 to 8 hours. Focus and concentration stable. Completing tasks. Managing aspects of household. Work going well Pharmacist, community. Denies SI or HI. Denies AH or VH.  Previous medication trials: Wellbutrin - rash.  PHQ2-9     Office Visit from 03/22/2017 in [facility] [place] Visit from 03/04/2016 in [facility]  PHQ-2 Total Score  3  0  PHQ-9 Total Score  10  --       Review of Systems:  Review of Systems  Musculoskeletal: Negative for gait problem.  Neurological: Negative for tremors.  Psychiatric/Behavioral:       Please refer to HPI    Medications: I have reviewed the patient's current medications.  Current Outpatient  Medications  Medication Sig Dispense Refill  . albuterol (PROAIR HFA) 108 (90 Base) MCG/ACT inhaler INHALE 2 PUFFS INTO THE LUNGS EVERY 6 (SIX) HOURS AS NEEDED FOR WHEEZING OR SHORTNESS OF BREATH. 8.5 Inhaler 0  . amphetamine-dextroamphetamine (ADDERALL XR) 20 MG 24 hr capsule dextroamphetamine-amphetamine ER 20 mg 24hr capsule,extend release    . amphetamine-dextroamphetamine (ADDERALL) 5 MG tablet SMARTSIG:0.5 Tablet(s) By Mouth As Needed    . ARIPiprazole (ABILIFY) 2 MG tablet Take 1 tablet (2 mg total) by mouth daily. 30 tablet 2  . Cholecalciferol (VITAMIN D3) 1.25 MG (50000 UT) CAPS SMARTSIG:1 Capsule(s) By Mouth Once a Week PRN    . doxepin (SINEQUAN) 10 MG capsule Take 1 capsule (10 mg total) by mouth at bedtime. 30 capsule 2  . FLUoxetine (PROZAC) 40 MG capsule Take 1 capsule (40 mg total) by mouth 2 (two) times daily. 30 capsule 5  . hydrOXYzine (VISTARIL) 25 MG capsule Take 1 capsule (25 mg total) by mouth 3 (three) times daily. 90 capsule 5  . ibuprofen (ADVIL,MOTRIN) 600 MG tablet 1 tab po q8h prn at onset of headache 30 tablet 3  . metroNIDAZOLE (METROCREAM) 0.75 % cream APPLY TO AFFECTED AREA EVERY DAY AS NEEDED    . QUEtiapine (SEROQUEL) 25 MG tablet Take two tablets at bedtime. 60 tablet 5  . tretinoin (RETIN-A) 0.025 % cream APPLY TO AFFECTED AREA EVERY DAY AT BEDTIME    . tretinoin (RETIN-A) 0.05 % cream Apply topically at bedtime. 45 g 0  . valACYclovir (VALTREX) 1000 MG tablet  No current facility-administered medications for this visit.    Medication Side Effects: None  Allergies:  Allergies  Allergen Reactions  . Apple Shortness Of Breath  . Acrylic Polymer [Carbomer]     Loses nail  . Latex     Latex "mild skin irritation"  . Other Other (See Comments)    STRAW / HAY.  SEVERE  ITCHING, CONGESTION.  . Adhesive [Tape] Rash  . Mango Flavor Rash    Past Medical History:  Diagnosis Date  . ADHD (attention deficit hyperactivity disorder)    Adderall used   . Asthma    childhood  . Complication of anesthesia    some difficulty awakening  . Headache    migraines in past- none recent  . Skin cancer    basal cell carcinoma    Family History  Problem Relation Age of Onset  . Alcohol abuse Father   . Arthritis Maternal Uncle   . Hyperlipidemia Maternal Uncle   . Alcohol abuse Paternal Aunt   . Arthritis Maternal Grandmother        DDD, spinal stenosis  . Hyperlipidemia Maternal Grandmother   . Alcohol abuse Maternal Grandfather   . Alcohol abuse Paternal Grandfather     Social History   Socioeconomic History  . Marital status: Single    Spouse name: Not on file  . Number of children: 0  . Years of education: Not on file  . Highest education level: Not on file  Occupational History    Employer: [employer]  Tobacco Use  . Smoking status: Former Smoker    Types: Cigarettes    Quit date: 06/30/2006    Years since quitting: 13.2  . Smokeless tobacco: Never Used  Substance and Sexual Activity  . Alcohol use: Yes    Comment: 1-3 drinks weekly  . Drug use: No  . Sexual activity: Yes    Birth control/protection: Pill  Other Topics Concern  . Not on file  Social History Narrative   Caffeine Use:  1 cup coffee daily   Regular exercise:  4 x weekly         Social Determinants of Health   Financial Resource Strain:   . Difficulty of Paying Living Expenses:   Food Insecurity:   . Worried About Charity fundraiser in the Last Year:   . Arboriculturist in the Last Year:   Transportation Needs:   . Film/video editor (Medical):   Marland Kitchen Lack of Transportation (Non-Medical):   Physical Activity:   . Days of Exercise per Week:   . Minutes of Exercise per Session:   Stress:   . Feeling of Stress :   Social Connections:   . Frequency of Communication with Friends and Family:   . Frequency of Social Gatherings with Friends and Family:   . Attends Religious Services:   . Active Member of Clubs or Organizations:   . Attends  Archivist Meetings:   Marland Kitchen Marital Status:   Intimate Partner Violence:   . Fear of Current or Ex-Partner:   . Emotionally Abused:   Marland Kitchen Physically Abused:   . Sexually Abused:     Past Medical History, Surgical history, Social history, and Family history were reviewed and updated as appropriate.   Please see review of systems for further details on the patient's review from today.   Objective:   Physical Exam:  There were no vitals taken for this visit.  Physical Exam Constitutional:  General: She is not in acute distress. HENT:     Right Ear: There is no impacted cerumen.  Musculoskeletal:        General: No deformity.  Neurological:     Mental Status: She is alert and oriented to person, place, and time.     Coordination: Coordination normal.  Psychiatric:        Attention and Perception: Attention and perception normal. She does not perceive auditory or visual hallucinations.        Mood and Affect: Mood normal. Mood is not anxious or depressed. Affect is not labile, blunt, angry or inappropriate.        Speech: Speech normal.        Behavior: Behavior normal.        Thought Content: Thought content normal. Thought content is not paranoid or delusional. Thought content does not include homicidal or suicidal ideation. Thought content does not include homicidal or suicidal plan.        Cognition and Memory: Cognition and memory normal.        Judgment: Judgment normal.     Comments: Insight intact     Lab Review:     Component Value Date/Time   NA 135 06/09/2017 1135   K 4.0 06/09/2017 1135   CL 101 06/09/2017 1135   CO2 28 06/09/2017 1135   GLUCOSE 83 06/09/2017 1135   BUN 12 06/09/2017 1135   CREATININE 0.88 06/09/2017 1135   CREATININE 0.80 05/10/2013 1451   CALCIUM 8.8 06/09/2017 1135   PROT 7.9 03/22/2017 1253   ALBUMIN 4.6 03/22/2017 1253   AST 10 03/22/2017 1253   ALT 10 03/22/2017 1253   ALKPHOS 55 03/22/2017 1253   BILITOT 0.7 03/22/2017  1253   GFRNONAA >89 05/10/2013 1451   GFRAA >89 05/10/2013 1451       Component Value Date/Time   WBC 7.0 03/22/2017 1253   RBC 4.58 03/22/2017 1253   HGB 13.8 03/22/2017 1253   HCT 41.5 03/22/2017 1253   PLT 276.0 03/22/2017 1253   MCV 90.7 03/22/2017 1253   MCH 29.5 11/29/2016 1622   MCHC 33.2 03/22/2017 1253   RDW 13.5 03/22/2017 1253   LYMPHSABS 2.3 03/22/2017 1253   MONOABS 0.6 03/22/2017 1253   EOSABS 0.1 03/22/2017 1253   BASOSABS 0.0 03/22/2017 1253    No results found for: POCLITH, LITHIUM   No results found for: PHENYTOIN, PHENOBARB, VALPROATE, CBMZ   .res Assessment: Plan:    Plan:  PDMP reviewed  1. Add Abilify 2mg  daily 2. Doxepin 10mg  at hs 3. Seroquel 25 to 50mg  at hs 4. Hydroxyzine 25mg  TID - not taking 5. Prozac 80mg  daily  Consider Stratera, stimulant  RTC 4 weeks  Patient advised to contact office with any questions, adverse effects, or acute worsening in signs and symptoms.  Discussed potential metabolic side effects associated with atypical antipsychotics, as well as potential risk for movement side effects. Advised pt to contact office if movement side effects occur.   Diagnoses and all orders for this visit:  Major depressive disorder, recurrent episode, moderate (HCC)  Generalized anxiety disorder  Insomnia, unspecified type  Alcohol abuse  Attention deficit hyperactivity disorder (ADHD), unspecified ADHD type -     ARIPiprazole (ABILIFY) 2 MG tablet; Take 1 tablet (2 mg total) by mouth daily.     Please see After Visit Summary for patient specific instructions.  Future Appointments  Date Time Provider [place]  09/19/2019  1:00 PM Barnie Del, LCSW [facility]  None  09/26/2019  1:00 PM Barnie Del, LCSW [facility] None    No orders of the defined types were placed in this encounter.   -------------------------------

## 2019-09-19 ENCOUNTER — Encounter: Payer: Self-pay | Admitting: Addiction (Substance Use Disorder)

## 2019-09-19 ENCOUNTER — Ambulatory Visit (INDEPENDENT_AMBULATORY_CARE_PROVIDER_SITE_OTHER): Payer: BC Managed Care – PPO | Admitting: Addiction (Substance Use Disorder)

## 2019-09-19 DIAGNOSIS — F4310 Post-traumatic stress disorder, unspecified: Secondary | ICD-10-CM | POA: Diagnosis not present

## 2019-09-19 DIAGNOSIS — F411 Generalized anxiety disorder: Secondary | ICD-10-CM

## 2019-09-19 NOTE — Progress Notes (Signed)
[facility] Counselor/Therapist Progress Note  Patient ID: Kristin Sandoval, MRN: [id number],    Date: 09/19/2019  Time Spent: 1:05-2:00 55 mins  Treatment Type: Individual Therapy  Reported Symptoms: old wounds, pain, sadness.anxiety.  Mental Status Exam:  Appearance:   Neat     Behavior:  Sharing  Motor:  Normal  Speech/Language:   Normal Rate  Affect:  Congruent, Full Range and Tearful  Mood:  anxious and sad  Thought process:  normal  Thought content:    Obsessions and Rumination  Sensory/Perceptual disturbances:    Flashbacks  Orientation:  x4  Attention:  Good  Concentration:  Fair  Memory:  WNL  Fund of knowledge:   Good  Insight:    Good  Judgment:   Good  Impulse Control:  Good   Risk Assessment: Danger to Self:  No Self-injurious Behavior: No Danger to Others: No Duty to Warn:no Physical Aggression / Violence:No  Access to Firearms a concern: No  Gang Involvement:No   Subjective: Client came insharing about old wounds, pain, sadness.anxiety. Client processed old wounds in addition to most recent trauma at inpatient treatment stay: being invalidated and told she was lying when sharing her darkest secrets/pains and then feeling more alone than ever. Therapist used MI and Grief therapy to help client feel nothing but support in session, seen and believed completely, and not alone. Therapist used BSP with client to help her reduce SUDs related to that pain of being alone and broken- in her chest a 9/10 until after processing it: moving to a 2/10. Client made progress in session processing trauma and emotionally regulating, causing her SUDs to decrease by 7 points. Client beginning to offer herself self-compassion.   Interventions: Cognitive Behavioral Therapy, Motivational Interviewing, Grief Therapy and Brainspotting  Diagnosis:   ICD-10-CM   1. PTSD (post-traumatic stress disorder)  F43.10   2. Generalized anxiety disorder  F41.1    Plan of Care: Client  to return for weekly therapy with Sammuel Cooper, therapist, for outpatient therapy, to review again in 3-6 months. Client is to continue seeing medication provider for support of mood management, triggers and cravings.   Client to continue engaging in more support for them in their SUD txt: engaging in 12 step meetings. Client to engage in [place] txt and RPT relapse prevention therapy AEB coming to therapy weekly and implementing recovery oriented coping strategies to help reduce use of substances and to find relief from symptoms of MH disorders and trauma that cause them to want to escape from reality and numb their mind&body.  Client also to create more stability & structure AEB goal planning with therapist to assist in helping them get into a healthy rhythm/ schedule that can help to calm their nervous system and begin to build more healthy brain neuropathways.  Client to engage in CBT: challenging negative internal ruminationsand self-talk AEB expressing toxic thoughts and challenging them with truth.  Client to practice DBT distress tolerance skills (such as distress tolerance and emotion regulation) to practice achieving wise mind and to build support for dealing with cravings AEB learning to ride the wave of emotion/sensation/etc instead of seeking negative self-soothing techniques: ie using substances to numb self.  Client to utilize BSP (brainspotting) with therapist to help client identify and process triggers for their MH/ SUD with goal of reducing SUDs by 33% each session. Client to prioritize sleep 8+ hours each week night AEB going to bed by 10pm each night. Client participated in the treatment  planning of their therapy. Client agreed with the plan and understands what to do if there is a crisis: call 9-1-1 and/or crisis line given by therapist.  Barnie Del, LCSW, LCAS, CCTP, CCS-I, BSP

## 2019-09-24 ENCOUNTER — Telehealth: Payer: Self-pay | Admitting: Adult Health

## 2019-09-24 NOTE — Telephone Encounter (Signed)
Noted  

## 2019-09-24 NOTE — Telephone Encounter (Signed)
Pt left message need to talk to [facility] bout Abilify. Return call @ [phone]

## 2019-09-25 ENCOUNTER — Other Ambulatory Visit: Payer: Self-pay

## 2019-09-25 DIAGNOSIS — L700 Acne vulgaris: Secondary | ICD-10-CM | POA: Diagnosis not present

## 2019-09-25 DIAGNOSIS — Z85828 Personal history of other malignant neoplasm of skin: Secondary | ICD-10-CM | POA: Diagnosis not present

## 2019-09-25 MED ORDER — RISPERIDONE 1 MG PO TABS: ORAL_TABLET | ORAL | 0 refills | Status: DC

## 2019-09-25 NOTE — Telephone Encounter (Signed)
She also mentioned she had genetic testing done last week and was asking for results.

## 2019-09-26 ENCOUNTER — Encounter: Payer: Self-pay | Admitting: Addiction (Substance Use Disorder)

## 2019-09-26 ENCOUNTER — Other Ambulatory Visit: Payer: Self-pay

## 2019-09-26 ENCOUNTER — Ambulatory Visit (INDEPENDENT_AMBULATORY_CARE_PROVIDER_SITE_OTHER): Payer: BC Managed Care – PPO | Admitting: Addiction (Substance Use Disorder)

## 2019-09-26 DIAGNOSIS — F411 Generalized anxiety disorder: Secondary | ICD-10-CM

## 2019-09-26 DIAGNOSIS — F4310 Post-traumatic stress disorder, unspecified: Secondary | ICD-10-CM

## 2019-09-26 NOTE — Telephone Encounter (Signed)
Did you see the other message I sent on this?

## 2019-09-26 NOTE — Progress Notes (Signed)
[facility] Counselor/Therapist Progress Note  Patient ID: Kristin Sandoval, MRN: [id number],    Date: 09/26/2019  Time Spent: 1:05-2:01 56 mins  Treatment Type: Individual Therapy  Reported Symptoms: frantic panic, anxiety, distress, trauma triggers.   Mental Status Exam:  Appearance:   Neat     Behavior:  Sharing  Motor:  Normal  Speech/Language:   Normal Rate  Affect:  Appropriate and Congruent  Mood:  anxious, irritable and labile  Thought process:  normal  Thought content:    Obsessions and Rumination  Sensory/Perceptual disturbances:    Flashbacks  Orientation:  x4  Attention:  Good  Concentration:  Fair  Memory:  WNL  Fund of knowledge:   Good  Insight:    Good  Judgment:   Good  Impulse Control:  Good   Risk Assessment: Danger to Self:  No Self-injurious Behavior: No Danger to Others: No Duty to Warn:no Physical Aggression / Violence:No  Access to Firearms a concern: No  Gang Involvement:No   Subjective: Client reported having major mood swings caused by a change in 2 medications. Client increased her Prozac to 80mg  and reported starting Abilify and having a flood of agitation, hypomania, frantic racing thoughts, and multiple panic attacks. Therapist used MI to help inquire more about client's experience and provide validation and support for her struggle. Client processed her inability to tolerate the panic attacks and the therapist used BSP to help the client explore what is causing the panic attacks. Therapist led client in mindfulness exercise to help her ground and become more aware of roots to her anxiety. Client used BSP and processed trauma triggers that came up when she felt hypomanic: she was reminded of a trauma connected to the nausea and frantic feelings/thoughts when the medicine caused the same symptoms. Therapist used Grief therapy with client to continue holding space for the client to process and work towards healing.   Interventions:  Mindfulness Meditation, Motivational Interviewing, Grief Therapy and Brainspotting  Diagnosis:   ICD-10-CM   1. PTSD (post-traumatic stress disorder)  F43.10   2. Generalized anxiety disorder  F41.1    Plan of Care: Client to return for weekly therapy with Sammuel Cooper, therapist, for outpatient therapy, to review again in 3-6 months. Client is to continue seeing medication provider for support of mood management, triggers and cravings.   Client to continue engaging in more support for them in their SUD txt: engaging in 12 step meetings. Client to engage in [place] txt and RPT relapse prevention therapy AEB coming to therapy weekly and implementing recovery oriented coping strategies to help reduce use of substances and to find relief from symptoms of MH disorders and trauma that cause them to want to escape from reality and numb their mind&body.  Client also to create more stability & structure AEB goal planning with therapist to assist in helping them get into a healthy rhythm/ schedule that can help to calm their nervous system and begin to build more healthy brain neuropathways.  Client to engage in CBT: challenging negative internal ruminationsand self-talk AEB expressing toxic thoughts and challenging them with truth.  Client to practice DBT distress tolerance skills (such as distress tolerance and emotion regulation) to practice achieving wise mind and to build support for dealing with cravings AEB learning to ride the wave of emotion/sensation/etc instead of seeking negative self-soothing techniques: ie using substances to numb self.  Client to utilize BSP (brainspotting) with therapist to help client identify and process triggers for  their MH/ SUD with goal of reducing SUDs by 33% each session. Client to prioritize sleep 8+ hours each week night AEB going to bed by 10pm each night. Client participated in the treatment planning of their therapy. Client agreed with the plan and understands what  to do if there is a crisis: call 9-1-1 and/or crisis line given by therapist.  Barnie Del, LCSW, LCAS, CCTP, CCS-I, BSP

## 2019-09-26 NOTE — Telephone Encounter (Signed)
Noted  

## 2019-10-14 ENCOUNTER — Other Ambulatory Visit: Payer: Self-pay

## 2019-10-14 ENCOUNTER — Encounter: Payer: Self-pay | Admitting: Adult Health

## 2019-10-14 ENCOUNTER — Ambulatory Visit (INDEPENDENT_AMBULATORY_CARE_PROVIDER_SITE_OTHER): Payer: BC Managed Care – PPO | Admitting: Adult Health

## 2019-10-14 DIAGNOSIS — G4700 Insomnia, unspecified: Secondary | ICD-10-CM | POA: Diagnosis not present

## 2019-10-14 DIAGNOSIS — F411 Generalized anxiety disorder: Secondary | ICD-10-CM

## 2019-10-14 DIAGNOSIS — F909 Attention-deficit hyperactivity disorder, unspecified type: Secondary | ICD-10-CM

## 2019-10-14 DIAGNOSIS — F331 Major depressive disorder, recurrent, moderate: Secondary | ICD-10-CM | POA: Diagnosis not present

## 2019-10-14 DIAGNOSIS — F4310 Post-traumatic stress disorder, unspecified: Secondary | ICD-10-CM

## 2019-10-14 MED ORDER — BUSPIRONE HCL 10 MG PO TABS: 10.0000 mg | ORAL_TABLET | Freq: Three times a day (TID) | ORAL | 2 refills | Status: DC

## 2019-10-14 MED ORDER — FLUOXETINE HCL 20 MG PO CAPS: ORAL_CAPSULE | ORAL | 2 refills | Status: DC

## 2019-10-14 MED ORDER — ESZOPICLONE 3 MG PO TABS: 3.0000 mg | ORAL_TABLET | Freq: Every day | ORAL | 2 refills | Status: DC

## 2019-10-14 NOTE — Progress Notes (Signed)
Kristin Sandoval [id number] 03/15/1986 34 y.o.  Subjective:   Patient ID:  Kristin Sandoval is a 34 y.o. (DOB Feb 18, 1986) female.  Chief Complaint: No chief complaint on file.   HPI Kristin Sandoval presents to the office today for follow-up of PTSD, GAD, insomnia, MDD, and PTSD.  Referred by Sammuel Cooper - therapist  Diagnosed with ADHD in 2012. Started on Adderall.   Describes mood today as "ok". Pleasant. Denies "crying spells". Mood symptoms - denies depression - "stable with that", anxiety (still a concern), irritable at times "sassy". Taking Prozac at 60mg . Did not tolerate Abilify "too activating" or Risperdal "seeing signs and symbols". Celebrated 6 months sobriety. Stable interest and motivation. Taking medications as prescribed.  Energy levels lower. Active, does not have a regular exercise routine. Works full-time - 40 hours.    Enjoys some usual interests and activities. Single. Lives alone. Working outdoors. Talking with friends. No family local.  Appetite adequate. Weight gain - 170 range. Sleeping difficulties. Averages 6 to 8 hours. Focus and concentration difficulties. Completing tasks. Managing aspects of household. 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 Denies SI or HI. Denies AH or VH.  Previous medication trials: Wellbutrin - rash, Abilify, Risperdal, Wellbutrin   PHQ2-9     Office Visit from 03/22/2017 in [facility] at [place] Visit from 03/04/2016 in [facility] at [place]  PHQ-2 Total Score  3  0  PHQ-9 Total Score  10  --      Review of Systems:  Review of Systems  Musculoskeletal: Negative for gait problem.  Neurological: Negative for tremors.  Psychiatric/Behavioral:       Please refer to HPI    Medications: I have reviewed the patient's current medications.  Current Outpatient Medications  Medication Sig Dispense Refill  . albuterol (PROAIR HFA) 108 (90  Base) MCG/ACT inhaler INHALE 2 PUFFS INTO THE LUNGS EVERY 6 (SIX) HOURS AS NEEDED FOR WHEEZING OR SHORTNESS OF BREATH. 8.5 Inhaler 0  . amphetamine-dextroamphetamine (ADDERALL XR) 20 MG 24 hr capsule dextroamphetamine-amphetamine ER 20 mg 24hr capsule,extend release    . amphetamine-dextroamphetamine (ADDERALL) 5 MG tablet SMARTSIG:0.5 Tablet(s) By Mouth As Needed    . ARIPiprazole (ABILIFY) 2 MG tablet Take 1 tablet (2 mg total) by mouth daily. 30 tablet 2  . Cholecalciferol (VITAMIN D3) 1.25 MG (50000 UT) CAPS SMARTSIG:1 Capsule(s) By Mouth Once a Week PRN    . doxepin (SINEQUAN) 10 MG capsule Take 1 capsule (10 mg total) by mouth at bedtime. 30 capsule 2  . FLUoxetine (PROZAC) 40 MG capsule Take 1 capsule (40 mg total) by mouth 2 (two) times daily. 30 capsule 5  . hydrOXYzine (VISTARIL) 25 MG capsule Take 1 capsule (25 mg total) by mouth 3 (three) times daily. 90 capsule 5  . ibuprofen (ADVIL,MOTRIN) 600 MG tablet 1 tab po q8h prn at onset of headache 30 tablet 3  . metroNIDAZOLE (METROCREAM) 0.75 % cream APPLY TO AFFECTED AREA EVERY DAY AS NEEDED    . QUEtiapine (SEROQUEL) 25 MG tablet Take two tablets at bedtime. 60 tablet 5  . risperiDONE (RISPERDAL) 1 MG tablet Take 1/2 tablet (0.5 mg) by mouth at bedtime for 7 nights, then increase to 1 tablet (1 mg) at bedtime. 30 tablet 0  . tretinoin (RETIN-A) 0.025 % cream APPLY TO AFFECTED AREA EVERY DAY AT BEDTIME    . tretinoin (RETIN-A) 0.05 % cream Apply topically at bedtime. [place]  g 0  . valACYclovir (VALTREX) 1000 MG tablet      No current facility-administered medications for this visit.    Medication Side Effects: None  Allergies:  Allergies  Allergen Reactions  . Apple Shortness Of Breath  . Acrylic Polymer [Carbomer]     Loses nail  . Latex     Latex "mild skin irritation"  . Other Other (See Comments)    STRAW / HAY.  SEVERE  ITCHING, CONGESTION.  . Adhesive [Tape] Rash  . Mango Flavor Rash    Past Medical History:  Diagnosis  Date  . ADHD (attention deficit hyperactivity disorder)    Adderall used  . Asthma    childhood  . Complication of anesthesia    some difficulty awakening  . Headache    migraines in past- none recent  . Skin cancer    basal cell carcinoma    Family History  Problem Relation Age of Onset  . Alcohol abuse Father   . Arthritis Maternal Uncle   . Hyperlipidemia Maternal Uncle   . Alcohol abuse Paternal Aunt   . Arthritis Maternal Grandmother        DDD, spinal stenosis  . Hyperlipidemia Maternal Grandmother   . Alcohol abuse Maternal Grandfather   . Alcohol abuse Paternal Grandfather     Social History   Socioeconomic History  . Marital status: Single    Spouse name: Not on file  . Number of children: 0  . Years of education: Not on file  . Highest education level: Not on file  Occupational History    Employer: [employer]  Tobacco Use  . Smoking status: Former Smoker    Types: Cigarettes    Quit date: 06/30/2006    Years since quitting: 13.2  . Smokeless tobacco: Never Used  Substance and Sexual Activity  . Alcohol use: Yes    Comment: 1-3 drinks weekly  . Drug use: No  . Sexual activity: Yes    Birth control/protection: Pill  Other Topics Concern  . Not on file  Social History Narrative   Caffeine Use:  1 cup coffee daily   Regular exercise:  4 x weekly         Social Determinants of Health   Financial Resource Strain:   . Difficulty of Paying Living Expenses:   Food Insecurity:   . Worried About Charity fundraiser in the Last Year:   . Arboriculturist in the Last Year:   Transportation Needs:   . Film/video editor (Medical):   Marland Kitchen Lack of Transportation (Non-Medical):   Physical Activity:   . Days of Exercise per Week:   . Minutes of Exercise per Session:   Stress:   . Feeling of Stress :   Social Connections:   . Frequency of Communication with Friends and Family:   . Frequency of Social Gatherings with Friends and Family:   . Attends  Religious Services:   . Active Member of Clubs or Organizations:   . Attends Archivist Meetings:   Marland Kitchen Marital Status:   Intimate Partner Violence:   . Fear of Current or Ex-Partner:   . Emotionally Abused:   Marland Kitchen Physically Abused:   . Sexually Abused:     Past Medical History, Surgical history, Social history, and Family history were reviewed and updated as appropriate.   Please see review of systems for further details on the patient's review from today.   Objective:   Physical Exam:  There were no  vitals taken for this visit.  Physical Exam Constitutional:      General: She is not in acute distress. Musculoskeletal:        General: No deformity.  Neurological:     Mental Status: She is alert and oriented to person, place, and time.     Coordination: Coordination normal.  Psychiatric:        Attention and Perception: Attention and perception normal. She does not perceive auditory or visual hallucinations.        Mood and Affect: Mood normal. Mood is not anxious or depressed. Affect is not labile, blunt, angry or inappropriate.        Speech: Speech normal.        Behavior: Behavior normal.        Thought Content: Thought content normal. Thought content is not paranoid or delusional. Thought content does not include homicidal or suicidal ideation. Thought content does not include homicidal or suicidal plan.        Cognition and Memory: Cognition and memory normal.        Judgment: Judgment normal.     Comments: Insight intact     Lab Review:     Component Value Date/Time   NA 135 06/09/2017 1135   K 4.0 06/09/2017 1135   CL 101 06/09/2017 1135   CO2 28 06/09/2017 1135   GLUCOSE 83 06/09/2017 1135   BUN 12 06/09/2017 1135   CREATININE 0.88 06/09/2017 1135   CREATININE 0.80 05/10/2013 1451   CALCIUM 8.8 06/09/2017 1135   PROT 7.9 03/22/2017 1253   ALBUMIN 4.6 03/22/2017 1253   AST 10 03/22/2017 1253   ALT 10 03/22/2017 1253   ALKPHOS 55 03/22/2017 1253    BILITOT 0.7 03/22/2017 1253   GFRNONAA >89 05/10/2013 1451   GFRAA >89 05/10/2013 1451       Component Value Date/Time   WBC 7.0 03/22/2017 1253   RBC 4.58 03/22/2017 1253   HGB 13.8 03/22/2017 1253   HCT 41.5 03/22/2017 1253   PLT 276.0 03/22/2017 1253   MCV 90.7 03/22/2017 1253   MCH 29.5 11/29/2016 1622   MCHC 33.2 03/22/2017 1253   RDW 13.5 03/22/2017 1253   LYMPHSABS 2.3 03/22/2017 1253   MONOABS 0.6 03/22/2017 1253   EOSABS 0.1 03/22/2017 1253   BASOSABS 0.0 03/22/2017 1253    No results found for: POCLITH, LITHIUM   No results found for: PHENYTOIN, PHENOBARB, VALPROATE, CBMZ   .res Assessment: Plan:    Plan:  PDMP reviewed  1. Prozac 60mg  to 40mg  daily 2. Add Lunesta 3mg  at bedtime 3. Add Buspar 10mg  TID  Consider Stratera, stimulant  RTC 4 weeks  Patient advised to contact office with any questions, adverse effects, or acute worsening in signs and symptoms.   There are no diagnoses linked to this encounter.   Please see After Visit Summary for patient specific instructions.  Future Appointments  Date Time Provider [place]  11/20/2019 11:00 AM Barnie Del, LCSW [facility] None  11/27/2019  2:00 PM Barnie Del, LCSW [facility] None  12/04/2019  2:00 PM Barnie Del, LCSW [facility] None  12/11/2019  2:00 PM Barnie Del, LCSW [facility] None  12/18/2019  2:00 PM Barnie Del, LCSW [facility] None    No orders of the defined types were placed in this encounter.   -------------------------------

## 2019-10-15 ENCOUNTER — Telehealth: Payer: Self-pay | Admitting: Adult Health

## 2019-10-15 ENCOUNTER — Other Ambulatory Visit: Payer: Self-pay

## 2019-10-15 DIAGNOSIS — F331 Major depressive disorder, recurrent, moderate: Secondary | ICD-10-CM

## 2019-10-15 DIAGNOSIS — G4700 Insomnia, unspecified: Secondary | ICD-10-CM

## 2019-10-15 DIAGNOSIS — F411 Generalized anxiety disorder: Secondary | ICD-10-CM

## 2019-10-15 DIAGNOSIS — F4310 Post-traumatic stress disorder, unspecified: Secondary | ICD-10-CM

## 2019-10-15 MED ORDER — BUSPIRONE HCL 10 MG PO TABS: 10.0000 mg | ORAL_TABLET | Freq: Three times a day (TID) | ORAL | 2 refills | Status: DC

## 2019-10-15 MED ORDER — FLUOXETINE HCL 20 MG PO CAPS: ORAL_CAPSULE | ORAL | 2 refills | Status: DC

## 2019-10-15 MED ORDER — ESZOPICLONE 3 MG PO TABS: 3.0000 mg | ORAL_TABLET | Freq: Every day | ORAL | 2 refills | Status: DC

## 2019-10-15 NOTE — Addendum Note (Signed)
Addended by: Aloha Gell on: 10/15/2019 03:40 PM   Modules accepted: Orders

## 2019-10-15 NOTE — Telephone Encounter (Signed)
[facility] taken off her profile, Rx's sent to [facility] instead

## 2019-10-15 NOTE — Telephone Encounter (Signed)
Pt called to advise no longer uses  Environmental education officer. Please sent all Rx to [facility]. Buspar, Eszopiclone & Prozac was sent to [facility] yesterday. Please send to [facility].

## 2019-10-16 ENCOUNTER — Other Ambulatory Visit: Payer: Self-pay | Admitting: Adult Health

## 2019-10-17 ENCOUNTER — Other Ambulatory Visit: Payer: Self-pay | Admitting: Adult Health

## 2019-10-17 DIAGNOSIS — F331 Major depressive disorder, recurrent, moderate: Secondary | ICD-10-CM

## 2019-10-17 DIAGNOSIS — F411 Generalized anxiety disorder: Secondary | ICD-10-CM

## 2019-10-17 DIAGNOSIS — G4700 Insomnia, unspecified: Secondary | ICD-10-CM

## 2019-10-17 DIAGNOSIS — F4310 Post-traumatic stress disorder, unspecified: Secondary | ICD-10-CM

## 2019-10-22 ENCOUNTER — Ambulatory Visit (INDEPENDENT_AMBULATORY_CARE_PROVIDER_SITE_OTHER): Payer: BC Managed Care – PPO | Admitting: Addiction (Substance Use Disorder)

## 2019-10-22 ENCOUNTER — Encounter: Payer: Self-pay | Admitting: Addiction (Substance Use Disorder)

## 2019-10-22 DIAGNOSIS — F411 Generalized anxiety disorder: Secondary | ICD-10-CM

## 2019-10-22 DIAGNOSIS — F4310 Post-traumatic stress disorder, unspecified: Secondary | ICD-10-CM | POA: Diagnosis not present

## 2019-10-22 NOTE — Progress Notes (Signed)
[facility] Counselor/Therapist Progress Note  Patient ID: Kristin Sandoval, MRN: [id number],    Date: 10/22/2019  Time Spent: 11:15-12:00 60mins  Treatment Type: Individual Therapy  Reported Symptoms: thoughts of old addictions/ risky behaviors, shopping sprees, mood shifts, anxiety.  Mental Status Exam:  Appearance:   Well Groomed     Behavior:  Sharing  Motor:  Normal  Speech/Language:   Clear and Coherent and Pressured  Affect:  Appropriate and Congruent  Mood:  anxious and labile  Thought process:  normal  Thought content:    Obsessions and Rumination  Sensory/Perceptual disturbances:    Flashbacks  Orientation:  x4  Attention:  Good  Concentration:  Fair  Memory:  WNL  Fund of knowledge:   Good  Insight:    Good  Judgment:   Good  Impulse Control:  Good   Risk Assessment: Danger to Self:  No Self-injurious Behavior: No Danger to Others: No Duty to Warn:no Physical Aggression / Violence:No  Access to Firearms a concern: No  Gang Involvement:No   Virtual Visit via Telephone Note I Connected with client by a video enabled telemedicine/telehealth application or telephone, with their informed consent, and verified client privacy and that I am speaking with the correct person using two identifiers. I discussed the limitations, risks, security and privacy concerns of performing psychotherapy and management service by telephone/teletherapy and the availability of in person appointments and confirmed their location. I also discussed with the patient that there may be a patient responsible charge related to this service and to confirm with the front desk if their insurance accepts teletherapy. The patient expressed understanding and agreed to proceed. I discussed the treatment planning with the client. The client was provided an opportunity to ask questions and all were answered. The client agreed with the plan and demonstrated an understanding of the instructions. The  client was advised to call our office if symptoms worsen or feel they are in a crisis state and need immediate contact. Client also reminded of a crisis line number and to use 9-1-1 if there's an emergency.  Therapist Location: office; Client Location: home. Subjective: Client reported dealing with ongoing depression episodes and mood swings. Client discussed her frustration with staying on Prozac regardless of her increased hypomania and shared her new results of the genetesting psych meds test. Client processed her irritation with having ongoing panic attacks and not feeling stabilized much even still. Therapist used MI and CBT with client to validate client's frustration and help her identify more of the thoughts causing her to get irritated and have little patience for the process of finding the right medications. Therapist used SFT with client to help her work through solutions to help her take notes on her symptoms and reactions to medications and to help her practice talking with the medication prescriber.   Interventions: Cognitive Behavioral Therapy, Motivational Interviewing and Solution-Oriented/Positive Psychology  Diagnosis:   ICD-10-CM   1. Generalized anxiety disorder  F41.1   2. PTSD (post-traumatic stress disorder)  F43.10    Plan of Care: Client to return for weekly therapy with Sammuel Cooper, therapist, for outpatient therapy, to review again in 3-6 months. Client is to continue seeing medication provider for support of mood management, triggers and cravings.   Client to continue engaging in more support for them in their SUD txt: engaging in 12 step meetings. Client to engage in [place] txt and RPT relapse prevention therapy AEB coming to therapy weekly and implementing recovery oriented coping  strategies to help reduce use of substances and to find relief from symptoms of MH disorders and trauma that cause them to want to escape from reality and numb their mind&body.  Client also to  create more stability & structure AEB goal planning with therapist to assist in helping them get into a healthy rhythm/ schedule that can help to calm their nervous system and begin to build more healthy brain neuropathways.  Client to engage in CBT: challenging negative internal ruminationsand self-talk AEB expressing toxic thoughts and challenging them with truth.  Client to practice DBT distress tolerance skills (such as distress tolerance and emotion regulation) to practice achieving wise mind and to build support for dealing with cravings AEB learning to ride the wave of emotion/sensation/etc instead of seeking negative self-soothing techniques: ie using substances to numb self.  Client to utilize BSP (brainspotting) with therapist to help client identify and process triggers for their MH/ SUD with goal of reducing SUDs by 33% each session. Client to prioritize sleep 8+ hours each week night AEB going to bed by 10pm each night. Client participated in the treatment planning of their therapy. Client agreed with the plan and understands what to do if there is a crisis: call 9-1-1 and/or crisis line given by therapist.  Barnie Del, LCSW, LCAS, CCTP, CCS-I, BSP

## 2019-10-28 ENCOUNTER — Encounter: Payer: Self-pay | Admitting: Adult Health

## 2019-10-28 ENCOUNTER — Telehealth (INDEPENDENT_AMBULATORY_CARE_PROVIDER_SITE_OTHER): Payer: BC Managed Care – PPO | Admitting: Adult Health

## 2019-10-28 ENCOUNTER — Telehealth: Payer: Self-pay | Admitting: Adult Health

## 2019-10-28 DIAGNOSIS — F331 Major depressive disorder, recurrent, moderate: Secondary | ICD-10-CM | POA: Diagnosis not present

## 2019-10-28 DIAGNOSIS — G4700 Insomnia, unspecified: Secondary | ICD-10-CM | POA: Diagnosis not present

## 2019-10-28 DIAGNOSIS — F411 Generalized anxiety disorder: Secondary | ICD-10-CM

## 2019-10-28 DIAGNOSIS — F909 Attention-deficit hyperactivity disorder, unspecified type: Secondary | ICD-10-CM

## 2019-10-28 DIAGNOSIS — F4310 Post-traumatic stress disorder, unspecified: Secondary | ICD-10-CM | POA: Diagnosis not present

## 2019-10-28 MED ORDER — LATUDA 20 MG PO TABS: 20.0000 mg | ORAL_TABLET | Freq: Every day | ORAL | 2 refills | Status: DC

## 2019-10-28 MED ORDER — ESZOPICLONE 1 MG PO TABS: 1.0000 mg | ORAL_TABLET | Freq: Every evening | ORAL | 2 refills | Status: DC | PRN

## 2019-10-28 NOTE — Progress Notes (Signed)
Kristin Sandoval [id number] 1985-08-20 34 y.o.  Virtual Visit via Video Note  I connected with pt @ on 10/28/19 at  5:20 PM EDT by a video enabled telemedicine application and verified that I am speaking with the correct person using two identifiers.   I discussed the limitations of evaluation and management by telemedicine and the availability of in person appointments. The patient expressed understanding and agreed to proceed.  I discussed the assessment and treatment plan with the patient. The patient was provided an opportunity to ask questions and all were answered. The patient agreed with the plan and demonstrated an understanding of the instructions.   The patient was advised to call back or seek an in-person evaluation if the symptoms worsen or if the condition fails to improve as anticipated.  I provided 30 minutes of non-face-to-face time during this encounter.  The patient was located at home. The provider was located at [place].   Kristin Gell, NP   Subjective:   Patient ID:  Kristin Sandoval is a 34 y.o. (DOB 02-28-1986) female.  Chief Complaint: No chief complaint on file.   HPI  Kristin Sandoval presents to the office today for follow-up of PTSD, GAD, insomnia, MDD, and PTSD.  Referred by Sammuel Cooper - therapist  Diagnosed with ADHD in 2012. Started on Adderall.   Describes mood today as "not too good". Pleasant. Tearful at times. Mood symptoms - reports  depression - "feeling apathetic". Feels anxious "some of the time". Has taken Buspar once in 3 days. Decreased irritability in the last few days. Not as irritable with the Abilify. Has decreased Prozac to 10mg  every other day. Has restarted the Abilify 2mg  daily a week ago. Celebrated 7 months sobriety. Stable interest and motivation. Taking medications as prescribed.  Energy levels lower. Active, does not have a regular exercise routine. Works full-time - 40 hours.    Unable to enjoy usual interests  and activities. Single. Lives alone. Talking with friends. No family local.  Appetite adequate - "eating garbage". Weight gain - 170. Sleeping difficulties - "having trouble". Averages 6 to 8 hours. Focus and concentration difficulties - in the garbage can". Completing tasks. Managing some aspects of household - "forcing myself to do things". Returned to work today. Missed 3 days of work last week. Denies SI or HI. Denies AH or VH.  Previous medication trials: Wellbutrin - rash, Abilify, Risperdal, Wellbutrin  Review of Systems:  Review of Systems  Musculoskeletal: Negative for gait problem.  Neurological: Negative for tremors.  Psychiatric/Behavioral:       Please refer to HPI    Medications: I have reviewed the patient's current medications.  Current Outpatient Medications  Medication Sig Dispense Refill  . albuterol (PROAIR HFA) 108 (90 Base) MCG/ACT inhaler INHALE 2 PUFFS INTO THE LUNGS EVERY 6 (SIX) HOURS AS NEEDED FOR WHEEZING OR SHORTNESS OF BREATH. 8.5 Inhaler 0  . busPIRone (BUSPAR) 10 MG tablet Take 1 tablet (10 mg total) by mouth 3 (three) times daily. 90 tablet 2  . Cholecalciferol (VITAMIN D3) 1.25 MG (50000 UT) CAPS SMARTSIG:1 Capsule(s) By Mouth Once a Week PRN    . Eszopiclone 3 MG TABS Take 1 tablet (3 mg total) by mouth at bedtime. Take immediately before bedtime 30 tablet 2  . FLUoxetine (PROZAC) 20 MG capsule Take three capsules daily. 90 capsule 2  . ibuprofen (ADVIL,MOTRIN) 600 MG tablet 1 tab po q8h prn at onset of headache 30 tablet 3  . metroNIDAZOLE (METROCREAM) 0.75 %  cream APPLY TO AFFECTED AREA EVERY DAY AS NEEDED    . tretinoin (RETIN-A) 0.025 % cream APPLY TO AFFECTED AREA EVERY DAY AT BEDTIME    . tretinoin (RETIN-A) 0.05 % cream Apply topically at bedtime. 45 g 0  . valACYclovir (VALTREX) 1000 MG tablet      No current facility-administered medications for this visit.    Medication Side Effects: None  Allergies:  Allergies  Allergen Reactions  .  Apple Shortness Of Breath  . Acrylic Polymer [Carbomer]     Loses nail  . Latex     Latex "mild skin irritation"  . Other Other (See Comments)    STRAW / HAY.  SEVERE  ITCHING, CONGESTION.  . Adhesive [Tape] Rash  . Mango Flavor Rash    Past Medical History:  Diagnosis Date  . ADHD (attention deficit hyperactivity disorder)    Adderall used  . Asthma    childhood  . Complication of anesthesia    some difficulty awakening  . Headache    migraines in past- none recent  . Skin cancer    basal cell carcinoma    Family History  Problem Relation Age of Onset  . Alcohol abuse Father   . Arthritis Maternal Uncle   . Hyperlipidemia Maternal Uncle   . Alcohol abuse Paternal Aunt   . Arthritis Maternal Grandmother        DDD, spinal stenosis  . Hyperlipidemia Maternal Grandmother   . Alcohol abuse Maternal Grandfather   . Alcohol abuse Paternal Grandfather     Social History   Socioeconomic History  . Marital status: Single    Spouse name: Not on file  . Number of children: 0  . Years of education: Not on file  . Highest education level: Not on file  Occupational History    Employer: [employer]  Tobacco Use  . Smoking status: Former Smoker    Types: Cigarettes    Quit date: 06/30/2006    Years since quitting: 13.3  . Smokeless tobacco: Never Used  Substance and Sexual Activity  . Alcohol use: Yes    Comment: 1-3 drinks weekly  . Drug use: No  . Sexual activity: Yes    Birth control/protection: Pill  Other Topics Concern  . Not on file  Social History Narrative   Caffeine Use:  1 cup coffee daily   Regular exercise:  4 x weekly         Social Determinants of Health   Financial Resource Strain:   . Difficulty of Paying Living Expenses:   Food Insecurity:   . Worried About Charity fundraiser in the Last Year:   . Arboriculturist in the Last Year:   Transportation Needs:   . Film/video editor (Medical):   Marland Kitchen Lack of Transportation (Non-Medical):    Physical Activity:   . Days of Exercise per Week:   . Minutes of Exercise per Session:   Stress:   . Feeling of Stress :   Social Connections:   . Frequency of Communication with Friends and Family:   . Frequency of Social Gatherings with Friends and Family:   . Attends Religious Services:   . Active Member of Clubs or Organizations:   . Attends Archivist Meetings:   Marland Kitchen Marital Status:   Intimate Partner Violence:   . Fear of Current or Ex-Partner:   . Emotionally Abused:   Marland Kitchen Physically Abused:   . Sexually Abused:     Past Medical History,  Surgical history, Social history, and Family history were reviewed and updated as appropriate.   Please see review of systems for further details on the patient's review from today.   Objective:   Physical Exam:  There were no vitals taken for this visit.  Physical Exam Neurological:     Mental Status: She is alert and oriented to person, place, and time.     Cranial Nerves: No dysarthria.  Psychiatric:        Attention and Perception: Attention and perception normal.        Mood and Affect: Mood is anxious and depressed.        Speech: Speech normal.        Behavior: Behavior is cooperative.        Thought Content: Thought content normal. Thought content is not paranoid or delusional. Thought content does not include homicidal or suicidal ideation. Thought content does not include homicidal or suicidal plan.        Cognition and Memory: Cognition and memory normal.        Judgment: Judgment normal.     Comments: Insight intact     Lab Review:     Component Value Date/Time   NA 135 06/09/2017 1135   K 4.0 06/09/2017 1135   CL 101 06/09/2017 1135   CO2 28 06/09/2017 1135   GLUCOSE 83 06/09/2017 1135   BUN 12 06/09/2017 1135   CREATININE 0.88 06/09/2017 1135   CREATININE 0.80 05/10/2013 1451   CALCIUM 8.8 06/09/2017 1135   PROT 7.9 03/22/2017 1253   ALBUMIN 4.6 03/22/2017 1253   AST 10 03/22/2017 1253   ALT 10  03/22/2017 1253   ALKPHOS 55 03/22/2017 1253   BILITOT 0.7 03/22/2017 1253   GFRNONAA >89 05/10/2013 1451   GFRAA >89 05/10/2013 1451       Component Value Date/Time   WBC 7.0 03/22/2017 1253   RBC 4.58 03/22/2017 1253   HGB 13.8 03/22/2017 1253   HCT 41.5 03/22/2017 1253   PLT 276.0 03/22/2017 1253   MCV 90.7 03/22/2017 1253   MCH 29.5 11/29/2016 1622   MCHC 33.2 03/22/2017 1253   RDW 13.5 03/22/2017 1253   LYMPHSABS 2.3 03/22/2017 1253   MONOABS 0.6 03/22/2017 1253   EOSABS 0.1 03/22/2017 1253   BASOSABS 0.0 03/22/2017 1253    No results found for: POCLITH, LITHIUM   No results found for: PHENYTOIN, PHENOBARB, VALPROATE, CBMZ   .res Assessment: Plan:    Plan:  PDMP reviewed  1. D/C Prozac 10mg  2. Add Latuda 20mg  daily 3. Buspar 10mg  as needed 4. D/C Abilify 2mg  daily 5. Lunesta 3mg  to 1mg   Consider Stratera, stimulant  RTC 2 weeks  Patient advised to contact office with any questions, adverse effects, or acute worsening in signs and symptoms.  Discussed potential metabolic side effects associated with atypical antipsychotics, as well as potential risk for movement side effects. Advised pt to contact office if movement side effects occur.   There are no diagnoses linked to this encounter.   Please see After Visit Summary for patient specific instructions.  Future Appointments  Date Time Provider [place]  11/20/2019 11:00 AM Barnie Del, LCSW [facility] None  11/27/2019  2:00 PM Barnie Del, LCSW [facility] None  12/04/2019  2:00 PM Barnie Del, LCSW [facility] None  12/11/2019  2:00 PM Barnie Del, LCSW [facility] None  12/18/2019  2:00 PM Barnie Del, LCSW [facility] None    No orders of the defined types  were placed in this encounter.     -------------------------------

## 2019-10-28 NOTE — Telephone Encounter (Signed)
Ms. cortnie, distler are scheduled for a virtual visit with your provider today.    Just as we do with appointments in the office, we must obtain your consent to participate.  Your consent will be active for this visit and any virtual visit you may have with one of our providers in the next 365 days.    If you have a MyChart account, I can also send a copy of this consent to you electronically.  All virtual visits are billed to your insurance company just like a traditional visit in the office.  As this is a virtual visit, video technology does not allow for your provider to perform a traditional examination.  This may limit your provider's ability to fully assess your condition.  If your provider identifies any concerns that need to be evaluated in person or the need to arrange testing such as labs, EKG, etc, we will make arrangements to do so.    Although advances in technology are sophisticated, we cannot ensure that it will always work on either your end or our end.  If the connection with a video visit is poor, we may have to switch to a telephone visit.  With either a video or telephone visit, we are not always able to ensure that we have a secure connection.   I need to obtain your verbal consent now.   Are you willing to proceed with your visit today?   SUZANN BORRELLO has provided verbal consent on 10/28/2019 for a virtual visit (video or telephone).   Aloha Gell, NP 10/28/2019  6:20 PM

## 2019-11-20 ENCOUNTER — Telehealth: Payer: Self-pay | Admitting: Adult Health

## 2019-11-20 ENCOUNTER — Ambulatory Visit (INDEPENDENT_AMBULATORY_CARE_PROVIDER_SITE_OTHER): Payer: BC Managed Care – PPO | Admitting: Addiction (Substance Use Disorder)

## 2019-11-20 ENCOUNTER — Encounter: Payer: Self-pay | Admitting: Addiction (Substance Use Disorder)

## 2019-11-20 DIAGNOSIS — F4310 Post-traumatic stress disorder, unspecified: Secondary | ICD-10-CM

## 2019-11-20 DIAGNOSIS — F411 Generalized anxiety disorder: Secondary | ICD-10-CM

## 2019-11-20 DIAGNOSIS — F331 Major depressive disorder, recurrent, moderate: Secondary | ICD-10-CM

## 2019-11-20 NOTE — Progress Notes (Signed)
[facility] Counselor/Therapist Progress Note  Patient ID: Kristin Sandoval, MRN: [id number],    Date: 11/20/2019  Time Spent: 1106-12:00 54 mins  Treatment Type: Individual Therapy  Reported Symptoms: sobriety, reduced anxiety, consistent depression, low motivation.   Mental Status Exam:  Appearance:   Neat     Behavior:  Sharing  Motor:  Normal  Speech/Language:   Clear and Coherent and Pressured  Affect:  Appropriate and Congruent  Mood:  anxious and labile  Thought process:  normal  Thought content:    Obsessions and Rumination  Sensory/Perceptual disturbances:    Flashbacks  Orientation:  x4  Attention:  Good  Concentration:  Fair  Memory:  WNL  Fund of knowledge:   Good  Insight:    Good  Judgment:   Good  Impulse Control:  Good   Risk Assessment: Danger to Self:  No Self-injurious Behavior: No Danger to Others: No Duty to Warn:no Physical Aggression / Violence:No  Access to Firearms a concern: No  Gang Involvement:No   Virtual Visit via Telephone Note I Connected with client by a video enabled telemedicine/telehealth application or telephone, with their informed consent, and verified client privacy and that I am speaking with the correct person using two identifiers. I discussed the limitations, risks, security and privacy concerns of performing psychotherapy and management service by telephone/teletherapy and the availability of in person appointments and confirmed their location. I also discussed with the patient that there may be a patient responsible charge related to this service and to confirm with the front desk if their insurance accepts teletherapy. The patient expressed understanding and agreed to proceed. I discussed the treatment planning with the client. The client was provided an opportunity to ask questions and all were answered. The client agreed with the plan and demonstrated an understanding of the instructions. The client was advised to call  our office if symptoms worsen or feel they are in a crisis state and need immediate contact. Client also reminded of a crisis line number and to use 9-1-1 if there's an emergency.  Therapist Location: office; Client Location: home.  Subjective: Client reported improved sobriety and reduction in cravings, using her tools from recovery. Client discussed her overall reduced anxiety, but consistent depression and lowered motivation. Client discussed how her hypomania decreased since off the prozac. Client discussed her inability to get her work done fast and her intense fatigue/lack of focus. Client discussed her thoughts of starting a ADHD medicine again and any concerns about taking it while in recovery with the therapist. Therapist used MI & SFT with client to help support her and reflect back her thoughts to assist in creating a plan for talking with her doctor. Therapist used RPT with client to help her consider parts of her recovery she wants to grow in and the interaction of an ADHD medicine with her sobriety.   Interventions: Motivational Interviewing, Solution-Oriented/Positive Psychology and RPT  Diagnosis:   ICD-10-CM   1. Generalized anxiety disorder  F41.1   2. PTSD (post-traumatic stress disorder)  F43.10   3. Major depressive disorder, recurrent episode, moderate ([place])  F33.1    Plan of Care: Client to return for weekly therapy with Sammuel Cooper, therapist, for outpatient therapy, to review again in 3-6 months. Client is to continue seeing medication provider for support of mood management, triggers and cravings.   Client to continue engaging in more support for them in their SUD txt: engaging in 12 step meetings. Client  to engage in SA txt and RPT relapse prevention therapy AEB coming to therapy weekly and implementing recovery oriented coping strategies to help reduce use of substances and to find relief from symptoms of MH disorders and trauma that cause them to want to escape from  reality and numb their mind&body.  Client also to create more stability & structure AEB goal planning with therapist to assist in helping them get into a healthy rhythm/ schedule that can help to calm their nervous system and begin to build more healthy brain neuropathways.  Client to engage in CBT: challenging negative internal ruminationsand self-talk AEB expressing toxic thoughts and challenging them with truth.  Client to practice DBT distress tolerance skills (such as distress tolerance and emotion regulation) to practice achieving wise mind and to build support for dealing with cravings AEB learning to ride the wave of emotion/sensation/etc instead of seeking negative self-soothing techniques: ie using substances to numb self.  Client to utilize BSP (brainspotting) with therapist to help client identify and process triggers for their MH/ SUD with goal of reducing SUDs by 33% each session. Client to prioritize sleep 8+ hours each week night AEB going to bed by 10pm each night. Client participated in the treatment planning of their therapy. Client agreed with the plan and understands what to do if there is a crisis: call 9-1-1 and/or crisis line given by therapist.  Barnie Del, LCSW, LCAS, CCTP, CCS-I, BSP

## 2019-11-20 NOTE — Telephone Encounter (Signed)
Patient called and  Left a message stating shewould like to talk about options for adhd medicines. Please call her at [phone].

## 2019-11-21 NOTE — Telephone Encounter (Signed)
I would like to have an appointment. Made medication changes last visit and would like to follow up on those before adding in a new medication.

## 2019-11-27 ENCOUNTER — Telehealth (INDEPENDENT_AMBULATORY_CARE_PROVIDER_SITE_OTHER): Payer: BC Managed Care – PPO | Admitting: Addiction (Substance Use Disorder)

## 2019-11-27 ENCOUNTER — Encounter: Payer: Self-pay | Admitting: Addiction (Substance Use Disorder)

## 2019-11-27 DIAGNOSIS — F411 Generalized anxiety disorder: Secondary | ICD-10-CM

## 2019-11-27 DIAGNOSIS — F4310 Post-traumatic stress disorder, unspecified: Secondary | ICD-10-CM

## 2019-11-27 NOTE — Progress Notes (Signed)
[facility] Counselor/Therapist Progress Note  Patient ID: JAKIRA MCFADDEN, MRN: [id number],    Date: 11/27/2019  Time Spent: 2:03-2:57 54 mins  Treatment Type: Individual Therapy  Reported Symptoms: sober, tired, some depression, low motivation and ability to focus.  Mental Status Exam:  Appearance:   Neat     Behavior:  Sharing  Motor:  Normal  Speech/Language:   Clear and Coherent and Pressured  Affect:  Appropriate and Congruent  Mood:  anxious and labile  Thought process:  normal  Thought content:    Obsessions and Rumination  Sensory/Perceptual disturbances:    Flashbacks  Orientation:  x4  Attention:  Good  Concentration:  Fair  Memory:  WNL  Fund of knowledge:   Good  Insight:    Good  Judgment:   Good  Impulse Control:  Good   Risk Assessment: Danger to Self:  No Self-injurious Behavior: No Danger to Others: No Duty to Warn:no Physical Aggression / Violence:No  Access to Firearms a concern: No  Gang Involvement:No   Virtual Visit via TELEPHONE : I connected with client by MyChart video enabled telemedicine/telehealth application, with their informed consent, and verified client privacy and that I am speaking with the correct person using two identifiers. I discussed the limitations, risks, security and privacy concerns of performing psychotherapy and management service virtually and confirmed their location. I also discussed with the patient that there may be a patient responsible charge related to this service and to confirm with the front desk if their insurance covers teletherapy. I also discussed with the patient the availability of in person appointments. The patient expressed understanding and agreed to proceed. I discussed the treatment planning with the client. The client was provided an opportunity to ask questions and all were answered. The client agreed with the plan and demonstrated an understanding of the instructions. The client was advised to  call our office if symptoms worsen or feel they are in a crisis state and need immediate contact. Client also reminded of a crisis line number and to use 9-1-1 if there's an emergency.  Therapist Location: office; Client Location: home.  Subjective: Client reported still being sober and denied cravings and big triggers. Therapist assessed for stability and used 12steps and RPT with client to review her goals for helping her maintain her sobriety and help her stay accountable. Client processed her desire to meet with the doctor again for ADHD meds since she's considered the risk/benefit of those. Client reported being very tired, some depression, low motivation and the inability to focus. Client and therapist discussed coping skills to increase her endorphins such as working out, but client shared her lack of motivation to move her body along with fear of assault/etc. Client shared about not wanting to get up and moving due to shame and low-self worth. Therapist used MI &CBT to support client and help her challenge negative self-critical thoughts.    Interventions: Cognitive Behavioral Therapy, Motivational Interviewing and RPT & 12 steps  Diagnosis:   ICD-10-CM   1. PTSD (post-traumatic stress disorder)  F43.10   2. Generalized anxiety disorder  F41.1    Plan of Care: Client to return for weekly therapy with Sammuel Cooper, therapist, for outpatient therapy, to review again in 3-6 months. Client is to continue seeing medication provider for support of mood management, triggers and cravings.   Client to continue engaging in more support for them in their SUD txt: engaging in 12 step meetings. Client  to engage in SA txt and RPT relapse prevention therapy AEB coming to therapy weekly and implementing recovery oriented coping strategies to help reduce use of substances and to find relief from symptoms of MH disorders and trauma that cause them to want to escape from reality and numb their mind&body.   Client also to create more stability & structure AEB goal planning with therapist to assist in helping them get into a healthy rhythm/ schedule that can help to calm their nervous system and begin to build more healthy brain neuropathways.  Client to engage in CBT: challenging negative internal ruminationsand self-talk AEB expressing toxic thoughts and challenging them with truth.  Client to practice DBT distress tolerance skills (such as distress tolerance and emotion regulation) to practice achieving wise mind and to build support for dealing with cravings AEB learning to ride the wave of emotion/sensation/etc instead of seeking negative self-soothing techniques: ie using substances to numb self.  Client to utilize BSP (brainspotting) with therapist to help client identify and process triggers for their MH/ SUD with goal of reducing SUDs by 33% each session. Client to prioritize sleep 8+ hours each week night AEB going to bed by 10pm each night. Client participated in the treatment planning of their therapy. Client agreed with the plan and understands what to do if there is a crisis: call 9-1-1 and/or crisis line given by therapist.  Barnie Del, LCSW, LCAS, CCTP, CCS-I, BSP

## 2019-12-02 ENCOUNTER — Telehealth (INDEPENDENT_AMBULATORY_CARE_PROVIDER_SITE_OTHER): Payer: BC Managed Care – PPO | Admitting: Adult Health

## 2019-12-02 ENCOUNTER — Telehealth: Payer: Self-pay | Admitting: Adult Health

## 2019-12-02 DIAGNOSIS — F4310 Post-traumatic stress disorder, unspecified: Secondary | ICD-10-CM

## 2019-12-02 DIAGNOSIS — F909 Attention-deficit hyperactivity disorder, unspecified type: Secondary | ICD-10-CM | POA: Diagnosis not present

## 2019-12-02 DIAGNOSIS — F331 Major depressive disorder, recurrent, moderate: Secondary | ICD-10-CM | POA: Diagnosis not present

## 2019-12-02 DIAGNOSIS — F411 Generalized anxiety disorder: Secondary | ICD-10-CM | POA: Diagnosis not present

## 2019-12-02 NOTE — Telephone Encounter (Signed)
Ms. adriene, knipfer are scheduled for a virtual visit with your provider today.    Just as we do with appointments in the office, we must obtain your consent to participate.  Your consent will be active for this visit and any virtual visit you may have with one of our providers in the next 365 days.    If you have a MyChart account, I can also send a copy of this consent to you electronically.  All virtual visits are billed to your insurance company just like a traditional visit in the office.  As this is a virtual visit, video technology does not allow for your provider to perform a traditional examination.  This may limit your provider's ability to fully assess your condition.  If your provider identifies any concerns that need to be evaluated in person or the need to arrange testing such as labs, EKG, etc, we will make arrangements to do so.    Although advances in technology are sophisticated, we cannot ensure that it will always work on either your end or our end.  If the connection with a video visit is poor, we may have to switch to a telephone visit.  With either a video or telephone visit, we are not always able to ensure that we have a secure connection.   I need to obtain your verbal consent now.   Are you willing to proceed with your visit today?   Alaze D Yanda has provided verbal consent on 12/02/2019 for a virtual visit (video or telephone).   Aloha Gell, NP 12/02/2019  4:59 PM

## 2019-12-02 NOTE — Progress Notes (Addendum)
Kristin Sandoval [id number] 1985-08-11 34 y.o.  Virtual Visit via Video Note  I connected with pt @ on 12/03/19 at  5:00 PM EDT by a video enabled telemedicine application and verified that I am speaking with the correct person using two identifiers.   I discussed the limitations of evaluation and management by telemedicine and the availability of in person appointments. The patient expressed understanding and agreed to proceed.  I discussed the assessment and treatment plan with the patient. The patient was provided an opportunity to ask questions and all were answered. The patient agreed with the plan and demonstrated an understanding of the instructions.   The patient was advised to call back or seek an in-person evaluation if the symptoms worsen or if the condition fails to improve as anticipated.  I provided 30 minutes of non-face-to-face time during this encounter.  The patient was located at home.  The provider was located at [place].   Aloha Gell, NP   Subjective:   Patient ID:  Kristin Sandoval is a 34 y.o. (DOB [birth year]) female.  Chief Complaint: No chief complaint on file.   HPI Kristin Sandoval presents for follow-up of PTSD, GAD, insomnia, MDD, and PTSD.   Describes mood today as "not good". Pleasant. Tearful at times. Mood symptoms - reports depression and irritability. Stating "the anxiety portion is under control". Feels "frustrated" with "where she is currently". Has tolerated addition of Latuda and is willing to increase to the 40mg . Discussed starting Vyvanse to help with the decreased interest and motivation. Feels distracted and overwhelmed at times. Not able to get the things done she needs to. Maintains 7/8 months sobriety. Taking medications as prescribed.  Energy levels lower. Active, does not have a regular exercise routine.     Enjoys some usual interests and activities. Single. Lives alone. Relationships are ok.  Appetite adequate. Trying  to make better choices - eating breakfast every day. Weight gain - 185's. Sleeping well at night. Averages 8 hours. Draggy during the day. Has used Seroquel some nights.  Focus and concentration difficulties. Difficulties absorbing new information.  Completing tasks. Managing some aspects of household. 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  Denies SI or HI. Denies AH or VH.  Previous medication trials: Wellbutrin - rash, Abilify, Risperdal, Wellbutrin  Review of Systems:  Review of Systems  Musculoskeletal: Negative for gait problem.  Neurological: Negative for tremors.  Psychiatric/Behavioral:       Please refer to HPI    Medications: I have reviewed the patient's current medications.  Current Outpatient Medications  Medication Sig Dispense Refill  . albuterol (PROAIR HFA) 108 (90 Base) MCG/ACT inhaler INHALE 2 PUFFS INTO THE LUNGS EVERY 6 (SIX) HOURS AS NEEDED FOR WHEEZING OR SHORTNESS OF BREATH. 8.5 Inhaler 0  . busPIRone (BUSPAR) 10 MG tablet Take 1 tablet (10 mg total) by mouth 3 (three) times daily. 90 tablet 2  . Cholecalciferol (VITAMIN D3) 1.25 MG (50000 UT) CAPS SMARTSIG:1 Capsule(s) By Mouth Once a Week PRN    . eszopiclone (LUNESTA) 1 MG TABS tablet Take 1 tablet (1 mg total) by mouth at bedtime as needed for sleep. Take immediately before bedtime 30 tablet 2  . eszopiclone (LUNESTA) 1 MG TABS tablet Take 1 tablet (1 mg total) by mouth at bedtime as needed for sleep. Take immediately before bedtime 30 tablet 2  . FLUoxetine (PROZAC) 20 MG capsule Take three capsules daily. 90 capsule 2  . ibuprofen (ADVIL,MOTRIN) 600 MG tablet 1  tab po q8h prn at onset of headache 30 tablet 3  . lurasidone (LATUDA) 20 MG TABS tablet Take 1 tablet (20 mg total) by mouth daily. 30 tablet 2  . metroNIDAZOLE (METROCREAM) 0.75 % cream APPLY TO AFFECTED AREA EVERY DAY AS NEEDED    . tretinoin (RETIN-A) 0.025 % cream APPLY TO AFFECTED AREA EVERY DAY AT BEDTIME    . tretinoin (RETIN-A) 0.05 %  cream Apply topically at bedtime. 45 g 0  . valACYclovir (VALTREX) 1000 MG tablet      No current facility-administered medications for this visit.    Medication Side Effects: None  Allergies:  Allergies  Allergen Reactions  . Apple Shortness Of Breath  . Acrylic Polymer [Carbomer]     Loses nail  . Latex     Latex "mild skin irritation"  . Other Other (See Comments)    STRAW / HAY.  SEVERE  ITCHING, CONGESTION.  . Adhesive [Tape] Rash  . Mango Flavor Rash    Past Medical History:  Diagnosis Date  . ADHD (attention deficit hyperactivity disorder)    Adderall used  . Asthma    childhood  . Complication of anesthesia    some difficulty awakening  . Headache    migraines in past- none recent  . Skin cancer    basal cell carcinoma    Family History  Problem Relation Age of Onset  . Alcohol abuse Father   . Arthritis Maternal Uncle   . Hyperlipidemia Maternal Uncle   . Alcohol abuse Paternal Aunt   . Arthritis Maternal Grandmother        DDD, spinal stenosis  . Hyperlipidemia Maternal Grandmother   . Alcohol abuse Maternal Grandfather   . Alcohol abuse Paternal Grandfather     Social History   Socioeconomic History  . Marital status: Single    Spouse name: Not on file  . Number of children: 0  . Years of education: Not on file  . Highest education level: Not on file  Occupational History    Employer: [employer]  Tobacco Use  . Smoking status: Former Smoker    Types: Cigarettes    Quit date: 06/30/2006    Years since quitting: 13.4  . Smokeless tobacco: Never Used  Substance and Sexual Activity  . Alcohol use: Yes    Comment: 1-3 drinks weekly  . Drug use: No  . Sexual activity: Yes    Birth control/protection: Pill  Other Topics Concern  . Not on file  Social History Narrative   Caffeine Use:  1 cup coffee daily   Regular exercise:  4 x weekly         Social Determinants of Health   Financial Resource Strain:   . Difficulty of Paying Living  Expenses:   Food Insecurity:   . Worried About Charity fundraiser in the Last Year:   . Arboriculturist in the Last Year:   Transportation Needs:   . Film/video editor (Medical):   Marland Kitchen Lack of Transportation (Non-Medical):   Physical Activity:   . Days of Exercise per Week:   . Minutes of Exercise per Session:   Stress:   . Feeling of Stress :   Social Connections:   . Frequency of Communication with Friends and Family:   . Frequency of Social Gatherings with Friends and Family:   . Attends Religious Services:   . Active Member of Clubs or Organizations:   . Attends Archivist Meetings:   .  Marital Status:   Intimate Partner Violence:   . Fear of Current or Ex-Partner:   . Emotionally Abused:   Marland Kitchen Physically Abused:   . Sexually Abused:     Past Medical History, Surgical history, Social history, and Family history were reviewed and updated as appropriate.   Please see review of systems for further details on the patient's review from today.   Objective:   Physical Exam:  There were no vitals taken for this visit.  Physical Exam Constitutional:      General: She is not in acute distress. Musculoskeletal:        General: No deformity.  Neurological:     Mental Status: She is alert and oriented to person, place, and time.     Coordination: Coordination normal.  Psychiatric:        Attention and Perception: Attention and perception normal. She does not perceive auditory or visual hallucinations.        Mood and Affect: Mood is depressed. Mood is not anxious. Affect is not labile, blunt, angry or inappropriate.        Speech: Speech normal.        Behavior: Behavior normal.        Thought Content: Thought content normal. Thought content is not paranoid or delusional. Thought content does not include homicidal or suicidal ideation. Thought content does not include homicidal or suicidal plan.        Cognition and Memory: Cognition and memory normal.         Judgment: Judgment normal.     Comments: Insight intact     Lab Review:     Component Value Date/Time   NA 135 06/09/2017 1135   K 4.0 06/09/2017 1135   CL 101 06/09/2017 1135   CO2 28 06/09/2017 1135   GLUCOSE 83 06/09/2017 1135   BUN 12 06/09/2017 1135   CREATININE 0.88 06/09/2017 1135   CREATININE 0.80 05/10/2013 1451   CALCIUM 8.8 06/09/2017 1135   PROT 7.9 03/22/2017 1253   ALBUMIN 4.6 03/22/2017 1253   AST 10 03/22/2017 1253   ALT 10 03/22/2017 1253   ALKPHOS 55 03/22/2017 1253   BILITOT 0.7 03/22/2017 1253   GFRNONAA >89 05/10/2013 1451   GFRAA >89 05/10/2013 1451       Component Value Date/Time   WBC 7.0 03/22/2017 1253   RBC 4.58 03/22/2017 1253   HGB 13.8 03/22/2017 1253   HCT 41.5 03/22/2017 1253   PLT 276.0 03/22/2017 1253   MCV 90.7 03/22/2017 1253   MCH 29.5 11/29/2016 1622   MCHC 33.2 03/22/2017 1253   RDW 13.5 03/22/2017 1253   LYMPHSABS 2.3 03/22/2017 1253   MONOABS 0.6 03/22/2017 1253   EOSABS 0.1 03/22/2017 1253   BASOSABS 0.0 03/22/2017 1253    No results found for: POCLITH, LITHIUM   No results found for: PHENYTOIN, PHENOBARB, VALPROATE, CBMZ   .res Assessment: Plan:    Plan:  PDMP reviewed  1. Increase Latuda 20mg  to 40mg  daily 3. Buspar 10mg  as needed - has not been taking 4. Lunesta 1mg   Consider Stratera, stimulant  Diagnosed with ADHD in 2012. Started on Adderall.   RTC 2 weeks  Patient advised to contact office with any questions, adverse effects, or acute worsening in signs and symptoms.  Discussed potential metabolic side effects associated with atypical antipsychotics, as well as potential risk for movement side effects. Advised pt to contact office if movement side effects occur.    Diagnoses and all orders for this  visit:  PTSD (post-traumatic stress disorder)  Generalized anxiety disorder  Major depressive disorder, recurrent episode, moderate ([facility])  Attention deficit hyperactivity disorder (ADHD),  unspecified ADHD type  Other orders -     eszopiclone (LUNESTA) 1 MG TABS tablet; Take 1 tablet (1 mg total) by mouth at bedtime as needed for sleep. Take immediately before bedtime     Please see After Visit Summary for patient specific instructions.  Future Appointments  Date Time Provider [place]  12/04/2019  2:00 PM Barnie Del, LCSW [facility] None  12/11/2019  2:00 PM Barnie Del, LCSW [facility] None  12/18/2019  2:00 PM Barnie Del, LCSW [facility] None  01/02/2020  1:00 PM Barnie Del, LCSW [facility] None  01/14/2020  1:00 PM Barnie Del, LCSW [facility] None  01/20/2020  2:00 PM Barnie Del, LCSW [facility] None    No orders of the defined types were placed in this encounter.     -------------------------------

## 2019-12-03 ENCOUNTER — Encounter: Payer: Self-pay | Admitting: Adult Health

## 2019-12-03 MED ORDER — ESZOPICLONE 1 MG PO TABS: 1.0000 mg | ORAL_TABLET | Freq: Every evening | ORAL | 2 refills | Status: DC | PRN

## 2019-12-04 ENCOUNTER — Encounter: Payer: Self-pay | Admitting: Addiction (Substance Use Disorder)

## 2019-12-04 ENCOUNTER — Ambulatory Visit (INDEPENDENT_AMBULATORY_CARE_PROVIDER_SITE_OTHER): Payer: BC Managed Care – PPO | Admitting: Addiction (Substance Use Disorder)

## 2019-12-04 ENCOUNTER — Ambulatory Visit: Payer: BC Managed Care – PPO | Admitting: Addiction (Substance Use Disorder)

## 2019-12-04 DIAGNOSIS — F4310 Post-traumatic stress disorder, unspecified: Secondary | ICD-10-CM | POA: Diagnosis not present

## 2019-12-04 NOTE — Progress Notes (Signed)
[facility] Counselor/Therapist Progress Note  Patient ID: Kristin Sandoval, MRN: [id number],    Date: 12/04/2019  Time Spent: 1:20-2:25 65 mins  Treatment Type: Individual Therapy  Reported Symptoms: irritated, angry, upset, scared.   Mental Status Exam:  Appearance:   Neat and Well Groomed     Behavior:  Appropriate and Sharing  Motor:  Normal  Speech/Language:   Clear and Coherent and Pressured  Affect:  Appropriate and Congruent  Mood:  anxious and sad  Thought process:  normal  Thought content:    Obsessions and Rumination  Sensory/Perceptual disturbances:    Flashbacks  Orientation:  x4  Attention:  Good  Concentration:  Fair  Memory:  WNL  Fund of knowledge:   Good  Insight:    Good  Judgment:   Good  Impulse Control:  Good   Risk Assessment: Danger to Self:  No Self-injurious Behavior: No Danger to Others: No Duty to Warn:no Physical Aggression / Violence:No  Access to Firearms a concern: No  Gang Involvement:No   Virtual Visit via VIDEO on mychart : I connected with client by MyChart video enabled telemedicine/telehealth application, with their informed consent, and verified client privacy and that I am speaking with the correct person using two identifiers. I discussed the limitations, risks, security and privacy concerns of performing psychotherapy and management service virtually and confirmed their location. I also discussed with the patient that there may be a patient responsible charge related to this service and to confirm with the front desk if their insurance covers teletherapy. I also discussed with the patient the availability of in person appointments. The patient expressed understanding and agreed to proceed. I discussed the treatment planning with the client. The client was provided an opportunity to ask questions and all were answered. The client agreed with the plan and demonstrated an understanding of the instructions. The client was advised  to call our office if symptoms worsen or feel they are in a crisis state and need immediate contact. Client also reminded of a crisis line number and to use 9-1-1 if there's an emergency.  Therapist Location: office; Client Location: home.  Subjective: Client reported feeling irritated, angry, upset, scared. Client processed trauma related to old partner and old feelings connected to guilt for cutting her out of her life. Therapist used BSP with client to assist her in processing the distress she feels when thinking about discussing arguments further with her ex, who she no longer trusts. Client made progress using bsp, processing it and her SUDs reduced to 1/10 with the new cognition that: her ex cant be trusted and she doesn't need to keep her in her life any longer. Therapist assessed for safety and stability and sobriety, using 12 steps, RPT and MI. Client denied SI/HI/AVH and reported sobriety and stability. Client making progress with regulation of her nerves, but struggling with continued depression related to old traumas.   Interventions: Cognitive Behavioral Therapy, Motivational Interviewing and RPT & 12 steps & BSP  Diagnosis:   ICD-10-CM   1. PTSD (post-traumatic stress disorder)  F43.10    Plan of Care: Client to return for weekly therapy with Sammuel Cooper, therapist, for outpatient therapy, to review again in 3-6 months. Client is to continue seeing medication provider for support of mood management, triggers and cravings.   Client to continue engaging in more support for them in their SUD txt: engaging in 12 step meetings. Client to engage in [place] txt and RPT  relapse prevention therapy AEB coming to therapy weekly and implementing recovery oriented coping strategies to help reduce use of substances and to find relief from symptoms of MH disorders and trauma that cause them to want to escape from reality and numb their mind&body.  Client also to create more stability & structure AEB goal  planning with therapist to assist in helping them get into a healthy rhythm/ schedule that can help to calm their nervous system and begin to build more healthy brain neuropathways.  Client to engage in CBT: challenging negative internal ruminationsand self-talk AEB expressing toxic thoughts and challenging them with truth.  Client to practice DBT distress tolerance skills (such as distress tolerance and emotion regulation) to practice achieving wise mind and to build support for dealing with cravings AEB learning to ride the wave of emotion/sensation/etc instead of seeking negative self-soothing techniques: ie using substances to numb self.  Client to utilize BSP (brainspotting) with therapist to help client identify and process triggers for their MH/ SUD with goal of reducing SUDs by 33% each session. Client to prioritize sleep 8+ hours each week night AEB going to bed by 10pm each night. Client participated in the treatment planning of their therapy. Client agreed with the plan and understands what to do if there is a crisis: call 9-1-1 and/or crisis line given by therapist.  Barnie Del, LCSW, LCAS, CCTP, CCS-I, BSP

## 2019-12-11 ENCOUNTER — Telehealth (INDEPENDENT_AMBULATORY_CARE_PROVIDER_SITE_OTHER): Payer: BC Managed Care – PPO | Admitting: Addiction (Substance Use Disorder)

## 2019-12-11 ENCOUNTER — Encounter: Payer: Self-pay | Admitting: Addiction (Substance Use Disorder)

## 2019-12-11 DIAGNOSIS — F4310 Post-traumatic stress disorder, unspecified: Secondary | ICD-10-CM

## 2019-12-11 NOTE — Progress Notes (Signed)
[facility] Counselor/Therapist Progress Note  Patient ID: MAKANA FEIGEL, MRN: [id number],    Date: 12/11/2019  Time Spent: 2:08-3:01 53 mins  Treatment Type: Individual Therapy  Reported Symptoms: Relaxed. Less depressed.   Mental Status Exam:  Appearance:   Casual     Behavior:  Appropriate and Sharing  Motor:  Normal  Speech/Language:   Clear and Coherent and Pressured  Affect:  Appropriate and Congruent  Mood:  normal  Thought process:  normal  Thought content:    Obsessions and Rumination  Sensory/Perceptual disturbances:    Flashbacks  Orientation:  x4  Attention:  Good  Concentration:  Fair  Memory:  WNL  Fund of knowledge:   Good  Insight:    Good  Judgment:   Good  Impulse Control:  Good   Risk Assessment: Danger to Self:  No Self-injurious Behavior: No Danger to Others: No Duty to Warn:no Physical Aggression / Violence:No  Access to Firearms a concern: No  Gang Involvement:No   Virtual Visit via VIDEO on mychart : I connected with client by MyChart video enabled telemedicine/telehealth application, with their informed consent, and verified client privacy and that I am speaking with the correct person using two identifiers. I discussed the limitations, risks, security and privacy concerns of performing psychotherapy and management service virtually and confirmed their location. I also discussed with the patient that there may be a patient responsible charge related to this service and to confirm with the front desk if their insurance covers teletherapy. I also discussed with the patient the availability of in person appointments. The patient expressed understanding and agreed to proceed. I discussed the treatment planning with the client. The client was provided an opportunity to ask questions and all were answered. The client agreed with the plan and demonstrated an understanding of the instructions. The client was advised to call our office if symptoms  worsen or feel they are in a crisis state and need immediate contact. Client also reminded of a crisis line number and to use 9-1-1 if there's an emergency.  Therapist Location: office; Client Location: home.  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 Client reported having less depression and taking her medications as prescribed. Therapist assessed for safety and client denied SI/HI/AVH and discussed how she is doing with depression in relation to holding onto toxic things in her life. Client processed areas in her life that she wants to work on and therapist used RPT with client to help client specify what her priorities are. Client denied cravings and reported coping with mild triggers such as her ongoing frustration with relationships that she keeps, as a trauma response. Therapist used CBT & MI with client to validate client's responses such as those that affect her behavior while also helping client to challenge her thoughts that ruminate causing her anxiety.  Interventions: Cognitive Behavioral Therapy, Motivational Interviewing and RPT  Diagnosis:   ICD-10-CM   1. PTSD (post-traumatic stress disorder)  F43.10    Plan of Care: Client to return for weekly therapy with Sammuel Cooper, therapist, for outpatient therapy, to review again in 3-6 months. Client is to continue seeing medication provider for support of mood management, triggers and cravings.   Client to continue engaging in more support for them in their SUD txt: engaging in 12 step meetings. Client to engage in [place]  txt and RPT relapse prevention therapy AEB coming to therapy weekly and implementing recovery oriented coping strategies to help reduce use of substances and to find relief from symptoms of MH disorders and trauma that cause them to want to escape from reality and numb their mind&body.  Client also to create more stability &  structure AEB goal planning with therapist to assist in helping them get into a healthy rhythm/ schedule that can help to calm their nervous system and begin to build more healthy brain neuropathways.  Client to engage in CBT: challenging negative internal ruminationsand self-talk AEB expressing toxic thoughts and challenging them with truth.  Client to practice DBT distress tolerance skills (such as distress tolerance and emotion regulation) to practice achieving wise mind and to build support for dealing with cravings AEB learning to ride the wave of emotion/sensation/etc instead of seeking negative self-soothing techniques: ie using substances to numb self.  Client to utilize BSP (brainspotting) with therapist to help client identify and process triggers for their MH/ SUD with goal of reducing SUDs by 33% each session. Client to prioritize sleep 8+ hours each week night AEB going to bed by 10pm each night. Client participated in the treatment planning of their therapy. Client agreed with the plan and understands what to do if there is a crisis: call 9-1-1 and/or crisis line given by therapist.  Barnie Del, LCSW, LCAS, CCTP, CCS-I, BSP

## 2019-12-16 ENCOUNTER — Telehealth (INDEPENDENT_AMBULATORY_CARE_PROVIDER_SITE_OTHER): Payer: BC Managed Care – PPO | Admitting: Adult Health

## 2019-12-16 ENCOUNTER — Encounter: Payer: Self-pay | Admitting: Adult Health

## 2019-12-16 DIAGNOSIS — F411 Generalized anxiety disorder: Secondary | ICD-10-CM | POA: Diagnosis not present

## 2019-12-16 DIAGNOSIS — F331 Major depressive disorder, recurrent, moderate: Secondary | ICD-10-CM | POA: Diagnosis not present

## 2019-12-16 DIAGNOSIS — F4310 Post-traumatic stress disorder, unspecified: Secondary | ICD-10-CM

## 2019-12-16 DIAGNOSIS — F909 Attention-deficit hyperactivity disorder, unspecified type: Secondary | ICD-10-CM

## 2019-12-16 DIAGNOSIS — G4700 Insomnia, unspecified: Secondary | ICD-10-CM

## 2019-12-16 MED ORDER — LISDEXAMFETAMINE DIMESYLATE 20 MG PO CAPS: 20.0000 mg | ORAL_CAPSULE | Freq: Every day | ORAL | 0 refills | Status: DC

## 2019-12-16 MED ORDER — ESZOPICLONE 1 MG PO TABS: 1.0000 mg | ORAL_TABLET | Freq: Every evening | ORAL | 2 refills | Status: DC | PRN

## 2019-12-16 NOTE — Progress Notes (Signed)
Kristin Sandoval [id number] 1986/01/28 34 y.o.  Virtual Visit via Video Note  I connected with pt @ on 12/16/19 at  1:40 PM EDT by a video enabled telemedicine application and verified that I am speaking with the correct person using two identifiers.   I discussed the limitations of evaluation and management by telemedicine and the availability of in person appointments. The patient expressed understanding and agreed to proceed.  I discussed the assessment and treatment plan with the patient. The patient was provided an opportunity to ask questions and all were answered. The patient agreed with the plan and demonstrated an understanding of the instructions.   The patient was advised to call back or seek an in-person evaluation if the symptoms worsen or if the condition fails to improve as anticipated.  I provided 30 minutes of non-face-to-face time during this encounter.  The patient was located at home.  The provider was located at [place].   Kristin Gell, NP   Subjective:   Patient ID:  Kristin Sandoval is a 34 y.o. (DOB 1985/12/19) female.  Chief Complaint: No chief complaint on file.   HPI Kristin Sandoval presents for follow-up of PTSD, GAD, insomnia, MDD, and PTSD.   Describes mood today as "ok". Pleasant. Tearful at times. Mood symptoms - reports decreased depression and irritability. Denies anxiety. Feels like increase in [place] has been helpful - "seeing benefits". Reporting some SSE. Has been able to "let things go easier and follow through with it". 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 Has trouble concentrating on things and completing tasks. Feels lethargic. Wanting to restart a stimulant for ADHD. Has taken Vyvanse in the past and would like to restart. Maintains 8 months sobriety. Taking medications as prescribed.  Energy levels lower. Having to force herself to do things. Active, does not have a regular exercise routine.     Enjoys some usual interests  and activities. Single. Dating. Lives alone.  Appetite adequate. Weight gain - 181 pounds.  Sleeping well most night. Averages 8 hours with Lunesta 1mg .  Focus and concentration difficulties. Stating "it's not getting any better". Completing tasks. Managing some aspects of household. 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 .  Denies SI or HI. Denies AH or VH.  Previous medication trials: Wellbutrin - rash, Abilify, Risperdal, Wellbutrin   Review of Systems:  Review of Systems  Musculoskeletal: Negative for gait problem.  Neurological: Negative for tremors.  Psychiatric/Behavioral:       Please refer to HPI    Medications: I have reviewed the patient's current medications.  Current Outpatient Medications  Medication Sig Dispense Refill   albuterol (PROAIR HFA) 108 (90 Base) MCG/ACT inhaler INHALE 2 PUFFS INTO THE LUNGS EVERY 6 (SIX) HOURS AS NEEDED FOR WHEEZING OR SHORTNESS OF BREATH. 8.5 Inhaler 0   busPIRone (BUSPAR) 10 MG tablet Take 1 tablet (10 mg total) by mouth 3 (three) times daily. 90 tablet 2   Cholecalciferol (VITAMIN D3) 1.25 MG (50000 UT) CAPS SMARTSIG:1 Capsule(s) By Mouth Once a Week PRN     eszopiclone (LUNESTA) 1 MG TABS tablet Take 1 tablet (1 mg total) by mouth at bedtime as needed for sleep. Take immediately before bedtime 30 tablet 2   ibuprofen (ADVIL,MOTRIN) 600 MG tablet 1 tab po q8h prn at onset of headache 30 tablet 3   lisdexamfetamine (VYVANSE) 20 MG capsule Take 1 capsule (20 mg total) by mouth daily. 30 capsule 0  lurasidone (LATUDA) 20 MG TABS tablet Take 1 tablet (20 mg total) by mouth daily. 30 tablet 2   metroNIDAZOLE (METROCREAM) 0.75 % cream APPLY TO AFFECTED AREA EVERY DAY AS NEEDED     tretinoin (RETIN-A) 0.025 % cream APPLY TO AFFECTED AREA EVERY DAY AT BEDTIME     tretinoin (RETIN-A) 0.05 % cream Apply topically at bedtime. 45 g 0   valACYclovir (VALTREX) 1000 MG tablet      No current  facility-administered medications for this visit.    Medication Side Effects: None  Allergies:  Allergies  Allergen Reactions   Apple Shortness Of Breath   Acrylic Polymer [Carbomer]     Loses nail   Latex     Latex "mild skin irritation"   Other Other (See Comments)    STRAW / HAY.  SEVERE  ITCHING, CONGESTION.   Adhesive [Tape] Rash   Mango Flavor Rash    Past Medical History:  Diagnosis Date   ADHD (attention deficit hyperactivity disorder)    Adderall used   Asthma    childhood   Complication of anesthesia    some difficulty awakening   Headache    migraines in past- none recent   Skin cancer    basal cell carcinoma    Family History  Problem Relation Age of Onset   Alcohol abuse Father    Arthritis Maternal Uncle    Hyperlipidemia Maternal Uncle    Alcohol abuse Paternal Aunt    Arthritis Maternal Grandmother        DDD, spinal stenosis   Hyperlipidemia Maternal Grandmother    Alcohol abuse Maternal Grandfather    Alcohol abuse Paternal Grandfather     Social History   Socioeconomic History   Marital status: Single    Spouse name: Not on file   Number of children: 0   Years of education: Not on file   Highest education level: Not on file  Occupational History    Employer: [employer]  Tobacco Use   Smoking status: Former Smoker    Types: Cigarettes    Quit date: 06/30/2006    Years since quitting: 13.4   Smokeless tobacco: Never Used  Substance and Sexual Activity   Alcohol use: Yes    Comment: 1-3 drinks weekly   Drug use: No   Sexual activity: Yes    Birth control/protection: Pill  Other Topics Concern   Not on file  Social History Narrative   Caffeine Use:  1 cup coffee daily   Regular exercise:  4 x weekly         Social Determinants of Health   Financial Resource Strain:    Difficulty of Paying Living Expenses:   Food Insecurity:    Worried About Charity fundraiser in the Last Year:    Academic librarian in the Last Year:   Transportation Needs:    Film/video editor (Medical):    Lack of Transportation (Non-Medical):   Physical Activity:    Days of Exercise per Week:    Minutes of Exercise per Session:   Stress:    Feeling of Stress :   Social Connections:    Frequency of Communication with Friends and Family:    Frequency of Social Gatherings with Friends and Family:    Attends Religious Services:    Active Member of Clubs or Organizations:    Attends Archivist Meetings:    Marital Status:   Intimate Partner Violence:    Fear of Current  or Ex-Partner:    Emotionally Abused:    Physically Abused:    Sexually Abused:     Past Medical History, Surgical history, Social history, and Family history were reviewed and updated as appropriate.   Please see review of systems for further details on the patient's review from today.   Objective:   Physical Exam:  There were no vitals taken for this visit.  Physical Exam Constitutional:      General: She is not in acute distress. Musculoskeletal:        General: No deformity.  Neurological:     Mental Status: She is alert and oriented to person, place, and time.     Coordination: Coordination normal.  Psychiatric:        Attention and Perception: Attention and perception normal. She does not perceive auditory or visual hallucinations.        Mood and Affect: Mood normal. Mood is not anxious or depressed. Affect is not labile, blunt, angry or inappropriate.        Speech: Speech normal.        Behavior: Behavior normal.        Thought Content: Thought content normal. Thought content is not paranoid or delusional. Thought content does not include homicidal or suicidal ideation. Thought content does not include homicidal or suicidal plan.        Cognition and Memory: Cognition and memory normal.        Judgment: Judgment normal.     Comments: Insight intact     Lab Review:     Component Value  Date/Time   NA 135 06/09/2017 1135   K 4.0 06/09/2017 1135   CL 101 06/09/2017 1135   CO2 28 06/09/2017 1135   GLUCOSE 83 06/09/2017 1135   BUN 12 06/09/2017 1135   CREATININE 0.88 06/09/2017 1135   CREATININE 0.80 05/10/2013 1451   CALCIUM 8.8 06/09/2017 1135   PROT 7.9 03/22/2017 1253   ALBUMIN 4.6 03/22/2017 1253   AST 10 03/22/2017 1253   ALT 10 03/22/2017 1253   ALKPHOS 55 03/22/2017 1253   BILITOT 0.7 03/22/2017 1253   GFRNONAA >89 05/10/2013 1451   GFRAA >89 05/10/2013 1451       Component Value Date/Time   WBC 7.0 03/22/2017 1253   RBC 4.58 03/22/2017 1253   HGB 13.8 03/22/2017 1253   HCT 41.5 03/22/2017 1253   PLT 276.0 03/22/2017 1253   MCV 90.7 03/22/2017 1253   MCH 29.5 11/29/2016 1622   MCHC 33.2 03/22/2017 1253   RDW 13.5 03/22/2017 1253   LYMPHSABS 2.3 03/22/2017 1253   MONOABS 0.6 03/22/2017 1253   EOSABS 0.1 03/22/2017 1253   BASOSABS 0.0 03/22/2017 1253    No results found for: POCLITH, LITHIUM   No results found for: PHENYTOIN, PHENOBARB, VALPROATE, CBMZ   .res Assessment: Plan:    Plan:  PDMP reviewed  Latuda 40mg  daily Buspar 10mg  as needed - has not been taking Lunesta 1mg   Add Vyvanse 20mg  daily   Consider Stratera, stimulant  Diagnosed with ADHD in 2012. Started on Adderall.   RTC 2 weeks  Patient advised to contact office with any questions, adverse effects, or acute worsening in signs and symptoms.  Discussed potential metabolic side effects associated with atypical antipsychotics, as well as potential risk for movement side effects. Advised pt to contact office if movement side effects occur.    Diagnoses and all orders for this visit:  PTSD (post-traumatic stress disorder)  Generalized anxiety disorder  Major  depressive disorder, recurrent episode, moderate (HCC)  Attention deficit hyperactivity disorder (ADHD), unspecified ADHD type -     lisdexamfetamine (VYVANSE) 20 MG capsule; Take 1 capsule (20 mg total) by  mouth daily.  Insomnia, unspecified type  Other orders -     eszopiclone (LUNESTA) 1 MG TABS tablet; Take 1 tablet (1 mg total) by mouth at bedtime as needed for sleep. Take immediately before bedtime     Please see After Visit Summary for patient specific instructions.  Future Appointments  Date Time Provider [place]  12/18/2019  2:00 PM Barnie Del, [place] [facility] None  01/02/2020  1:00 PM Barnie Del, LCSW [facility] None  01/14/2020  1:00 PM Barnie Del, LCSW [facility] None  01/20/2020  2:00 PM Barnie Del, LCSW [facility] None  01/27/2020  2:00 PM Barnie Del, LCSW [facility] None  02/12/2020  2:00 PM Barnie Del, LCSW [facility] None    No orders of the defined types were placed in this encounter.     -------------------------------

## 2019-12-18 ENCOUNTER — Telehealth (INDEPENDENT_AMBULATORY_CARE_PROVIDER_SITE_OTHER): Payer: BC Managed Care – PPO | Admitting: Addiction (Substance Use Disorder)

## 2019-12-18 ENCOUNTER — Encounter: Payer: Self-pay | Admitting: Addiction (Substance Use Disorder)

## 2019-12-18 DIAGNOSIS — F4310 Post-traumatic stress disorder, unspecified: Secondary | ICD-10-CM

## 2019-12-18 NOTE — Progress Notes (Signed)
[facility] Counselor/Therapist Progress Note  Patient ID: Kristin Sandoval, MRN: [id number],    Date: 12/18/2019  Time Spent: 2:03-3:01 57 mins  Treatment Type: Individual Therapy  Reported Symptoms: anxious, sad  Mental Status Exam:  Appearance:   Casual and Neat     Behavior:  Appropriate and Sharing  Motor:  Normal  Speech/Language:   Clear and Coherent and Pressured  Affect:  Appropriate and Congruent  Mood:  anxious and sad  Thought process:  normal  Thought content:    Obsessions and Rumination  Sensory/Perceptual disturbances:    Flashbacks  Orientation:  x4  Attention:  Good  Concentration:  Fair  Memory:  WNL  Fund of knowledge:   Good  Insight:    Good  Judgment:   Good  Impulse Control:  Good   Risk Assessment: Danger to Self:  No Self-injurious Behavior: No Danger to Others: No Duty to Warn:no Physical Aggression / Violence:No  Access to Firearms a concern: No  Gang Involvement:No   Virtual Visit via VIDEO on mychart : I connected with client by MyChart video enabled telemedicine/telehealth application, with their informed consent, and verified client privacy and that I am speaking with the correct person using two identifiers. I discussed the limitations, risks, security and privacy concerns of performing psychotherapy and management service virtually and confirmed their location. I also discussed with the patient that there may be a patient responsible charge related to this service and to confirm with the front desk if their insurance covers teletherapy. I also discussed with the patient the availability of in person appointments. The patient expressed understanding and agreed to proceed. I discussed the treatment planning with the client. The client was provided an opportunity to ask questions and all were answered. The client agreed with the plan and demonstrated an understanding of the instructions. The client was advised to call our office if  symptoms worsen or feel they are in a crisis state and need immediate contact. Client also reminded of a crisis line number and to use 9-1-1 if there's an emergency.  Therapist Location: office; Client Location: home.  Subjective: Client reported struggling with ongoing depression and anxiety mostly. Client processed stressors that make her anxious that are connected to sensory overload, like mowing the lawn. Client identified the flush hot and sick feeling she felt in her chest when thinking of the past experiences with her dad and mowing when she was young. Therapist used MI to support client as she processed the pain from past wounds and mindfulness to validate the sensations she experiences when thinking of mowing the lawn. Therapist use brainspotting with client to help her process her SUDs of 8/10 related to being inadequate and failing/messing up the mowing of the lawn. 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  Therapist assessed for safety and client denied SI/HI/AVH.  Interventions: Mindfulness Meditation, Motivational Interviewing and Brainspotting  Diagnosis:   ICD-10-CM   1. PTSD (post-traumatic stress disorder)  F43.10    Plan of Care: Client to return for weekly therapy with Sammuel Cooper, therapist, for outpatient therapy, to review again in 3-6 months. Client is to continue seeing medication provider for support of mood management, triggers and cravings.   Client to  continue engaging in more support for them in their SUD txt: engaging in 12 step meetings. Client to engage in [place] txt and RPT relapse prevention therapy AEB coming to therapy weekly and implementing recovery oriented coping strategies to help reduce use of substances and to find relief from symptoms of  MH disorders and trauma that cause them to want to escape from reality and numb their mind&body.  Client also to create more stability & structure AEB goal planning with therapist to assist in helping them get into a healthy rhythm/ schedule that can help to calm their nervous system and begin to build more healthy brain neuropathways.  Client to engage in CBT: challenging negative internal ruminationsand self-talk AEB expressing toxic thoughts and challenging them with truth.  Client to practice DBT distress tolerance skills (such as distress tolerance and emotion regulation) to practice achieving wise mind and to build support for dealing with cravings AEB learning to ride the wave of emotion/sensation/etc instead of seeking negative self-soothing techniques: ie using substances to numb self.  Client to utilize BSP (brainspotting) with therapist to help client identify and process triggers for their MH/ SUD with goal of reducing SUDs by 33% each session. Client to prioritize sleep 8+ hours each week night AEB going to bed by 10pm each night. Client participated in the treatment planning of their therapy. Client agreed with the plan and understands what to do if there is a crisis: call 9-1-1 and/or crisis line given by therapist.  Barnie Del, LCSW, LCAS, CCTP, CCS-I, BSP

## 2019-12-20 ENCOUNTER — Telehealth: Payer: Self-pay

## 2019-12-20 NOTE — Telephone Encounter (Signed)
Prior authorization submitted and approved for VYVANSE 20 MG with [employer] [employer] effective 12/18/2019-12/17/2022

## 2019-12-26 ENCOUNTER — Other Ambulatory Visit: Payer: Self-pay | Admitting: Adult Health

## 2019-12-26 DIAGNOSIS — G4700 Insomnia, unspecified: Secondary | ICD-10-CM

## 2020-01-02 ENCOUNTER — Ambulatory Visit (INDEPENDENT_AMBULATORY_CARE_PROVIDER_SITE_OTHER): Payer: BC Managed Care – PPO | Admitting: Addiction (Substance Use Disorder)

## 2020-01-02 ENCOUNTER — Other Ambulatory Visit: Payer: Self-pay

## 2020-01-02 ENCOUNTER — Encounter: Payer: Self-pay | Admitting: Addiction (Substance Use Disorder)

## 2020-01-02 DIAGNOSIS — F4310 Post-traumatic stress disorder, unspecified: Secondary | ICD-10-CM

## 2020-01-02 DIAGNOSIS — F411 Generalized anxiety disorder: Secondary | ICD-10-CM | POA: Diagnosis not present

## 2020-01-02 NOTE — Progress Notes (Signed)
[facility] Counselor/Therapist Progress Note  Patient ID: SOCORRO KANITZ, MRN: [id number],    Date: 01/02/2020  Time Spent: 73mns  Treatment Type: Individual Therapy  Reported Symptoms: irritated, motivated, thinking of the future.   Mental Status Exam:  Appearance:   Casual and Well Groomed     Behavior:  Appropriate and Sharing  Motor:  Normal  Speech/Language:   Clear and Coherent and Pressured  Affect:  Appropriate and Congruent  Mood:  irritable  Thought process:  circumstantial and flight of ideas  Thought content:    Obsessions and Rumination  Sensory/Perceptual disturbances:    Flashbacks  Orientation:  x4  Attention:  Good  Concentration:  Fair  Memory:  WNL  Fund of knowledge:   Good  Insight:    Good  Judgment:   Good  Impulse Control:  Good   Risk Assessment: Danger to Self:  No Self-injurious Behavior: No Danger to Others: No Duty to Warn:no Physical Aggression / Violence:No  Access to Firearms a concern: No  Gang Involvement:No   Subjective: Client reported feeling more stable with her mood and on a good medication dose. Client processed starting vyvanse and the concern that her personality could change and she could be dependent on it one day. Therapist used MI/MET, 12 steps, and RPT with client to support client as she processes the changes in her recovery of MH & SA due to medication and also due to emotional work she has been doing with therapy. Therapist also used 12 steps and and RPT to help encourage client to keep in mind her need to surrender to the recovery process and give up any obstacles that get in her way of her full recovery, going over details of her recovery safety plan that client needs to engage with/use regularly. Client reported feeling more motivated in her life overall and feeling less depressed and emotionally distressed and is making progress using RPT & MET to think of the future that she wants. Therapist assessed for safety  and client denied SI/HI/AVH and cravings. Client considering ways to keep her cravings at [place] they are.  Interventions: Cognitive Behavioral Therapy, Motivational Interviewing and RPT & 12 steps  Diagnosis:   ICD-10-CM   1. PTSD (post-traumatic stress disorder)  F43.10   2. Generalized anxiety disorder  F41.1    Plan of Care: Client to return for weekly therapy with KSammuel Cooper therapist, for outpatient therapy, to review again in 3-6 months. Client is to continue seeing medication provider for support of mood management, triggers and cravings.   Client to continue engaging in more support for them in their SUD txt: engaging in 12 step meetings. Client to engage in [place] and RPT relapse prevention therapy AEB coming to therapy weekly and implementing recovery oriented coping strategies to help reduce use of substances and to find relief from symptoms of MH disorders and trauma that cause them to want to escape from reality and numb their mind&body.  Client also to create more stability & structure AEB goal planning with therapist to assist in helping them get into a healthy rhythm/ schedule that can help to calm their nervous system and begin to build more healthy brain neuropathways.  Client to engage in CBT: challenging negative internal ruminationsand self-talk AEB expressing toxic thoughts and challenging them with truth.  Client to practice DBT distress tolerance skills (such as distress tolerance and emotion regulation) to practice achieving wise mind and to build support  for dealing with cravings AEB learning to ride the wave of emotion/sensation/etc instead of seeking negative self-soothing techniques: ie using substances to numb self.  Client to utilize BSP (brainspotting) with therapist to help client identify and process triggers for their MH/ SUD with goal of reducing SUDs by 33% each session. Client to prioritize sleep 8+ hours each week night AEB going to bed by 10pm each  night. Client participated in the treatment planning of their therapy. Client agreed with the plan and understands what to do if there is a crisis: call 9-1-1 and/or crisis line given by therapist.  Barnie Del, LCSW, LCAS, CCTP, CCS-I, BSP

## 2020-01-09 ENCOUNTER — Encounter: Payer: Self-pay | Admitting: Adult Health

## 2020-01-09 ENCOUNTER — Telehealth (INDEPENDENT_AMBULATORY_CARE_PROVIDER_SITE_OTHER): Payer: BC Managed Care – PPO | Admitting: Adult Health

## 2020-01-09 DIAGNOSIS — F331 Major depressive disorder, recurrent, moderate: Secondary | ICD-10-CM | POA: Diagnosis not present

## 2020-01-09 DIAGNOSIS — F909 Attention-deficit hyperactivity disorder, unspecified type: Secondary | ICD-10-CM

## 2020-01-09 DIAGNOSIS — F4310 Post-traumatic stress disorder, unspecified: Secondary | ICD-10-CM

## 2020-01-09 DIAGNOSIS — G4700 Insomnia, unspecified: Secondary | ICD-10-CM

## 2020-01-09 DIAGNOSIS — F411 Generalized anxiety disorder: Secondary | ICD-10-CM | POA: Diagnosis not present

## 2020-01-09 MED ORDER — LISDEXAMFETAMINE DIMESYLATE 20 MG PO CAPS: 20.0000 mg | ORAL_CAPSULE | Freq: Every day | ORAL | 0 refills | Status: DC

## 2020-01-09 MED ORDER — QUETIAPINE FUMARATE 25 MG PO TABS: 25.0000 mg | ORAL_TABLET | Freq: Every day | ORAL | 2 refills | Status: DC

## 2020-01-09 MED ORDER — LATUDA 20 MG PO TABS: 20.0000 mg | ORAL_TABLET | Freq: Every day | ORAL | 2 refills | Status: DC

## 2020-01-09 NOTE — Progress Notes (Signed)
Kristin Sandoval [id number] May 30, 1986 34 y.o.  Virtual Visit via Video Note  I connected with pt @ on 01/09/20 at 11:40 AM EDT by a video enabled telemedicine application and verified that I am speaking with the correct person using two identifiers.   I discussed the limitations of evaluation and management by telemedicine and the availability of in person appointments. The patient expressed understanding and agreed to proceed.  I discussed the assessment and treatment plan with the patient. The patient was provided an opportunity to ask questions and all were answered. The patient agreed with the plan and demonstrated an understanding of the instructions.   The patient was advised to call back or seek an in-person evaluation if the symptoms worsen or if the condition fails to improve as anticipated.  I provided 30 minutes of non-face-to-face time during this encounter.  The patient was located at home.  The provider was located at [place].   Aloha Gell, NP   Subjective:   Patient ID:  Kristin Sandoval is a 34 y.o. (DOB [birth year]) female.  Chief Complaint: No chief complaint on file.   HPI Kristin Sandoval presents for follow-up of PTSD, GAD, insomnia, MDD, and PTSD.  Describes mood today as "so-so". Tearful at times. Mood symptoms - reports more depression since last visit. Reports some anxiety and irritability. Stating "my don't give a damn is too strong". Feels like she is tuck in "nuetral". Stating "I don't have anything to say to anybody about anything". Feels "indifferent". Does feel like the addition of Vyvanse has been helpful. Getting things done around the farm. Able to start and finish tasks. Maintains 9 months sobriety.  Energy levels improved. Active, has started an exercise routine.  Enjoys some usual interests and activities. Single. Dating. Lives alone. Talking with friends. Getting out some.  Appetite adequate. Making better food choices. Weight  stable - 180 pounds.  Sleeping well most night. Averages 8 hours with Seroquel 25mg  at bedtime. Was unable to get the [facility].  Focus and concentration "improved". Completing tasks. Managing some aspects of household. 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  Denies SI or HI. Denies AH or VH.  Previous medication trials: Wellbutrin - rash, Abilify, Risperdal, Wellbutrin  Review of Systems:  Review of Systems  Musculoskeletal: Negative for gait problem.  Neurological: Negative for tremors.  Psychiatric/Behavioral:       Please refer to HPI    Medications: I have reviewed the patient's current medications.  Current Outpatient Medications  Medication Sig Dispense Refill  . albuterol (PROAIR HFA) 108 (90 Base) MCG/ACT inhaler INHALE 2 PUFFS INTO THE LUNGS EVERY 6 (SIX) HOURS AS NEEDED FOR WHEEZING OR SHORTNESS OF BREATH. 8.5 Inhaler 0  . busPIRone (BUSPAR) 10 MG tablet Take 1 tablet (10 mg total) by mouth 3 (three) times daily. 90 tablet 2  . Cholecalciferol (VITAMIN D3) 1.25 MG (50000 UT) CAPS SMARTSIG:1 Capsule(s) By Mouth Once a Week PRN    . eszopiclone (LUNESTA) 1 MG TABS tablet Take 1 tablet (1 mg total) by mouth at bedtime as needed for sleep. Take immediately before bedtime 30 tablet 2  . ibuprofen (ADVIL,MOTRIN) 600 MG tablet 1 tab po q8h prn at onset of headache 30 tablet 3  . lisdexamfetamine (VYVANSE) 20 MG capsule Take 1 capsule (20 mg total) by mouth daily. 30 capsule 0  . lurasidone (LATUDA) 20 MG TABS tablet Take 1 tablet (20 mg total) by mouth daily. 30 tablet 2  . metroNIDAZOLE (METROCREAM)  0.75 % cream APPLY TO AFFECTED AREA EVERY DAY AS NEEDED    . QUEtiapine (SEROQUEL) 25 MG tablet Take 1 tablet (25 mg total) by mouth at bedtime. 30 tablet 2  . tretinoin (RETIN-A) 0.025 % cream APPLY TO AFFECTED AREA EVERY DAY AT BEDTIME    . tretinoin (RETIN-A) 0.05 % cream Apply topically at bedtime. 45 g 0  . valACYclovir (VALTREX) 1000 MG tablet      No current  facility-administered medications for this visit.    Medication Side Effects: None  Allergies:  Allergies  Allergen Reactions  . Apple Shortness Of Breath  . Acrylic Polymer [Carbomer]     Loses nail  . Latex     Latex "mild skin irritation"  . Other Other (See Comments)    STRAW / HAY.  SEVERE  ITCHING, CONGESTION.  . Adhesive [Tape] Rash  . Mango Flavor Rash    Past Medical History:  Diagnosis Date  . ADHD (attention deficit hyperactivity disorder)    Adderall used  . Asthma    childhood  . Complication of anesthesia    some difficulty awakening  . Headache    migraines in past- none recent  . Skin cancer    basal cell carcinoma    Family History  Problem Relation Age of Onset  . Alcohol abuse Father   . Arthritis Maternal Uncle   . Hyperlipidemia Maternal Uncle   . Alcohol abuse Paternal Aunt   . Arthritis Maternal Grandmother        DDD, spinal stenosis  . Hyperlipidemia Maternal Grandmother   . Alcohol abuse Maternal Grandfather   . Alcohol abuse Paternal Grandfather     Social History   Socioeconomic History  . Marital status: Single    Spouse name: Not on file  . Number of children: 0  . Years of education: Not on file  . Highest education level: Not on file  Occupational History    Employer: [employer]  Tobacco Use  . Smoking status: Former Smoker    Types: Cigarettes    Quit date: 06/30/2006    Years since quitting: 13.5  . Smokeless tobacco: Never Used  Substance and Sexual Activity  . Alcohol use: Yes    Comment: 1-3 drinks weekly  . Drug use: No  . Sexual activity: Yes    Birth control/protection: Pill  Other Topics Concern  . Not on file  Social History Narrative   Caffeine Use:  1 cup coffee daily   Regular exercise:  4 x weekly         Social Determinants of Health   Financial Resource Strain:   . Difficulty of Paying Living Expenses:   Food Insecurity:   . Worried About Charity fundraiser in the Last Year:   . Academic librarian in the Last Year:   Transportation Needs:   . Film/video editor (Medical):   Marland Kitchen Lack of Transportation (Non-Medical):   Physical Activity:   . Days of Exercise per Week:   . Minutes of Exercise per Session:   Stress:   . Feeling of Stress :   Social Connections:   . Frequency of Communication with Friends and Family:   . Frequency of Social Gatherings with Friends and Family:   . Attends Religious Services:   . Active Member of Clubs or Organizations:   . Attends Archivist Meetings:   Marland Kitchen Marital Status:   Intimate Partner Violence:   . Fear of Current or Ex-Partner:   .  Emotionally Abused:   Marland Kitchen Physically Abused:   . Sexually Abused:     Past Medical History, Surgical history, Social history, and Family history were reviewed and updated as appropriate.   Please see review of systems for further details on the patient's review from today.   Objective:   Physical Exam:  There were no vitals taken for this visit.  Physical Exam Constitutional:      General: She is not in acute distress. Musculoskeletal:        General: No deformity.  Neurological:     Mental Status: She is alert and oriented to person, place, and time.     Coordination: Coordination normal.  Psychiatric:        Attention and Perception: Attention and perception normal. She does not perceive auditory or visual hallucinations.        Mood and Affect: Mood normal. Mood is not anxious or depressed. Affect is not labile, blunt, angry or inappropriate.        Speech: Speech normal.        Behavior: Behavior normal.        Thought Content: Thought content normal. Thought content is not paranoid or delusional. Thought content does not include homicidal or suicidal ideation. Thought content does not include homicidal or suicidal plan.        Cognition and Memory: Cognition and memory normal.        Judgment: Judgment normal.     Comments: Insight intact     Lab Review:     Component Value  Date/Time   NA 135 06/09/2017 1135   K 4.0 06/09/2017 1135   CL 101 06/09/2017 1135   CO2 28 06/09/2017 1135   GLUCOSE 83 06/09/2017 1135   BUN 12 06/09/2017 1135   CREATININE 0.88 06/09/2017 1135   CREATININE 0.80 05/10/2013 1451   CALCIUM 8.8 06/09/2017 1135   PROT 7.9 03/22/2017 1253   ALBUMIN 4.6 03/22/2017 1253   AST 10 03/22/2017 1253   ALT 10 03/22/2017 1253   ALKPHOS 55 03/22/2017 1253   BILITOT 0.7 03/22/2017 1253   GFRNONAA >89 05/10/2013 1451   GFRAA >89 05/10/2013 1451       Component Value Date/Time   WBC 7.0 03/22/2017 1253   RBC 4.58 03/22/2017 1253   HGB 13.8 03/22/2017 1253   HCT 41.5 03/22/2017 1253   PLT 276.0 03/22/2017 1253   MCV 90.7 03/22/2017 1253   MCH 29.5 11/29/2016 1622   MCHC 33.2 03/22/2017 1253   RDW 13.5 03/22/2017 1253   LYMPHSABS 2.3 03/22/2017 1253   MONOABS 0.6 03/22/2017 1253   EOSABS 0.1 03/22/2017 1253   BASOSABS 0.0 03/22/2017 1253    No results found for: POCLITH, LITHIUM   No results found for: PHENYTOIN, PHENOBARB, VALPROATE, CBMZ   .res Assessment: Plan:    Plan:  PDMP reviewed  Latuda 40mg  to 20mg  daily Seroquel 25mg  at hs Vyvanse 20mg  daily   RTC 4 weeks  Patient advised to contact office with any questions, adverse effects, or acute worsening in signs and symptoms.  Discussed potential metabolic side effects associated with atypical antipsychotics, as well as potential risk for movement side effects. Advised pt to contact office if movement side effects occur.   Discussed potential benefits, risks, and side effects of stimulants with patient to include increased heart rate, palpitations, insomnia, increased anxiety, increased irritability, or decreased appetite.  Instructed patient to contact office if experiencing any significant tolerability issues.     Diagnoses and all orders  for this visit:  PTSD (post-traumatic stress disorder)  Generalized anxiety disorder  Major depressive disorder, recurrent  episode, moderate (HCC) -     lurasidone (LATUDA) 20 MG TABS tablet; Take 1 tablet (20 mg total) by mouth daily.  Attention deficit hyperactivity disorder (ADHD), unspecified ADHD type -     lisdexamfetamine (VYVANSE) 20 MG capsule; Take 1 capsule (20 mg total) by mouth daily.  Insomnia, unspecified type -     QUEtiapine (SEROQUEL) 25 MG tablet; Take 1 tablet (25 mg total) by mouth at bedtime.     Please see After Visit Summary for patient specific instructions.  Future Appointments  Date Time Provider [place]  01/14/2020  1:00 PM Barnie Del, LCSW [facility] None  01/20/2020  2:00 PM Barnie Del, LCSW [facility] None  01/27/2020  2:00 PM Barnie Del, LCSW [facility] None  02/12/2020  2:00 PM Barnie Del, LCSW [facility] None  02/26/2020  2:00 PM Barnie Del, LCSW [facility] None  03/11/2020  2:00 PM Barnie Del, LCSW [facility] None    No orders of the defined types were placed in this encounter.     -------------------------------

## 2020-01-14 ENCOUNTER — Encounter: Payer: Self-pay | Admitting: Addiction (Substance Use Disorder)

## 2020-01-14 ENCOUNTER — Ambulatory Visit (INDEPENDENT_AMBULATORY_CARE_PROVIDER_SITE_OTHER): Payer: BC Managed Care – PPO | Admitting: Addiction (Substance Use Disorder)

## 2020-01-14 DIAGNOSIS — F4310 Post-traumatic stress disorder, unspecified: Secondary | ICD-10-CM

## 2020-01-14 NOTE — Progress Notes (Signed)
[facility] Counselor/Therapist Progress Note  Patient ID: Kristin Sandoval, MRN: [id number],    Date: 01/14/2020  Time Spent: 4mins  Treatment Type: Individual Therapy  Reported Symptoms: sad, unsettled, curious.   Mental Status Exam:  Appearance:   Casual and Well Groomed     Behavior:  Appropriate and Sharing  Motor:  Normal  Speech/Language:   Clear and Coherent and Pressured  Affect:  Appropriate and Congruent  Mood:  anxious and sad  Thought process:  circumstantial and flight of ideas  Thought content:    Obsessions and Rumination  Sensory/Perceptual disturbances:    Flashbacks  Orientation:  x4  Attention:  Good  Concentration:  Fair  Memory:  WNL  Fund of knowledge:   Good  Insight:    Good  Judgment:   Good  Impulse Control:  Good   Risk Assessment: Danger to Self:  No Self-injurious Behavior: No Danger to Others: No Duty to Warn:no Physical Aggression / Violence:No  Access to Firearms a concern: No  Gang Involvement:No    Virtual Visit via VIDEO: I connected with client by MyChart video enabled telemedicine/telehealth application, with their informed consent, and verified client privacy and that I am speaking with the correct person using two identifiers. I discussed the limitations, risks, security and privacy concerns of performing psychotherapy and management service virtually and confirmed their location. I also discussed with the patient that there may be a patient responsible charge related to this service and to confirm with the front desk if their insurance covers teletherapy. I also discussed with the patient the availability of in person appointments. The patient expressed understanding and agreed to proceed. I discussed the treatment planning with the client. The client was provided an opportunity to ask questions and all were answered. The client agreed with the plan and demonstrated an understanding of the instructions. The client was advised  to call our office if symptoms worsen or feel they are in a crisis state and need immediate contact. Client also reminded of a crisis line number and to use 9-1-1 if there's an emergency.  Therapist Location: office; Client Location: home.  Subjective: Client reported struggling with sadness, curiosity, and feeling unsettled somewhat still following re-reading her trauma story. Client processed feeling sad and hurt after reading it again and hearing things she had buried emotionally. Therapist used MI & CBT with client to further explore the pain and support her as she shares her wounds, validating her pain. Client processed coping with: never having parents who "gave a shit" or a "safe place to turn". Therapist used fftt trauma therapy with client to further explore her pain related to those traumas and mindfulness to keep client grounded as she was sharing the pain. Client became overwhelmed when discussing what she read in her trauma story last week that reopened wounds and therapist used RPT with client, reminding her of her coping tools in her toolbox, modeling mindfulness for client and leading her in that, to help lessen the emotional distress she feels when processing the sensations she feels as a result of those traumas. Therapist assessed for safety and sobriety and client denied SI/HI/AVH and denied cravings/triggers/substance use.   Interventions: Cognitive Behavioral Therapy, Motivational Interviewing and RPT & fftt trauma therapy  Diagnosis:   ICD-10-CM   1. PTSD (post-traumatic stress disorder)  F43.10    Plan of Care: Client to return for weekly therapy with Sammuel Cooper, therapist, for outpatient therapy, to review again in  3-6 months. Client is to continue seeing medication provider for support of mood management, triggers and cravings.   Client to continue engaging in more support for them in their SUD txt: engaging in 12 step meetings. Client to engage in [place] txt and RPT relapse  prevention therapy AEB coming to therapy weekly and implementing recovery oriented coping strategies to help reduce use of substances and to find relief from symptoms of MH disorders and trauma that cause them to want to escape from reality and numb their mind&body.  Client also to create more stability & structure AEB goal planning with therapist to assist in helping them get into a healthy rhythm/ schedule that can help to calm their nervous system and begin to build more healthy brain neuropathways.  Client to engage in CBT: challenging negative internal ruminationsand self-talk AEB expressing toxic thoughts and challenging them with truth.  Client to practice DBT distress tolerance skills (such as distress tolerance and emotion regulation) to practice achieving wise mind and to build support for dealing with cravings AEB learning to ride the wave of emotion/sensation/etc instead of seeking negative self-soothing techniques: ie using substances to numb self.  Client to utilize BSP (brainspotting) with therapist to help client identify and process triggers for their MH/ SUD with goal of reducing SUDs by 33% each session. Client to prioritize sleep 8+ hours each week night AEB going to bed by 10pm each night. Client participated in the treatment planning of their therapy. Client agreed with the plan and understands what to do if there is a crisis: call 9-1-1 and/or crisis line given by therapist.  Barnie Del, LCSW, LCAS, CCTP, CCS-I, BSP

## 2020-01-20 ENCOUNTER — Other Ambulatory Visit: Payer: Self-pay

## 2020-01-20 ENCOUNTER — Encounter: Payer: Self-pay | Admitting: Addiction (Substance Use Disorder)

## 2020-01-20 ENCOUNTER — Ambulatory Visit (INDEPENDENT_AMBULATORY_CARE_PROVIDER_SITE_OTHER): Payer: BC Managed Care – PPO | Admitting: Addiction (Substance Use Disorder)

## 2020-01-20 DIAGNOSIS — F4310 Post-traumatic stress disorder, unspecified: Secondary | ICD-10-CM | POA: Diagnosis not present

## 2020-01-20 NOTE — Progress Notes (Signed)
[facility] Counselor/Therapist Progress Note  Patient ID: Kristin Sandoval, MRN: [id number],    Date: 01/20/2020  Time Spent: 67mins  Treatment Type: Individual Therapy  Reported Symptoms: tearful, anxious, overwhelmed.   Mental Status Exam:  Appearance:   Casual and Well Groomed     Behavior:  Sharing  Motor:  Normal  Speech/Language:   Clear and Coherent and Pressured  Affect:  Appropriate and Congruent  Mood:  anxious  Thought process:  circumstantial  Thought content:    Obsessions and Rumination  Sensory/Perceptual disturbances:    Flashbacks  Orientation:  x4  Attention:  Good  Concentration:  Fair  Memory:  WNL  Fund of knowledge:   Good  Insight:    Good  Judgment:   Good  Impulse Control:  Good   Risk Assessment: Danger to Self:  No Self-injurious Behavior: No Danger to Others: No Duty to Warn:no Physical Aggression / Violence:No  Access to Firearms a concern: No  Gang Involvement:No   Subjective: Client reported feeling anxious, overwhelmed, and sad lately. Client processed all the triggers for the change of wellbeing including tapering on her medication and also losing her bf to jail. Client shared about her understanding that he wasn't good for her, but not being able to feel that confidence in her body or feel less alone without him. Client became tearful and felt overwhelmed reporting SUDs of 7/10 in her body. Therapist used MI & mindfulness to validate client's struggle while helping her to get grounded and breathing to regulate herself. Therapist also used Brainspotting with client to help her process the sensations in her body until she reported feeling calm & 0/10 SUDs when thinking about how she feels internally today. Client made progress by understanding her need to let herself take up space in her life to help her ground emotionally when stressed while also letting go of things out of her control, using ACT. Therapist used CBT with client to help  her process more detailed examples of those "things she needs to surrender to find freedom". Therapist assessed for safety and sobriety and client denied SI/HI/AVH and denied cravings/triggers/substance use.   Interventions: Cognitive Behavioral Therapy, Motivational Interviewing and RPT & fftt trauma therapy & brainspotting & ACT  Diagnosis:   ICD-10-CM   1. PTSD (post-traumatic stress disorder)  F43.10    Plan of Care: Client to return for weekly therapy with Sammuel Cooper, therapist, for outpatient therapy, to review again in 3-6 months. Client is to continue seeing medication provider for support of mood management, triggers and cravings.   Client to continue engaging in more support for them in their SUD txt: engaging in 12 step meetings. Client to engage in [place] txt and RPT relapse prevention therapy AEB coming to therapy weekly and implementing recovery oriented coping strategies to help reduce use of substances and to find relief from symptoms of MH disorders and trauma that cause them to want to escape from reality and numb their mind&body.  Client also to create more stability & structure AEB goal planning with therapist to assist in helping them get into a healthy rhythm/ schedule that can help to calm their nervous system and begin to build more healthy brain neuropathways.  Client to engage in CBT: challenging negative internal ruminationsand self-talk AEB expressing toxic thoughts and challenging them with truth.  Client to practice DBT distress tolerance skills (such as distress tolerance and emotion regulation) to practice achieving wise mind and to build  support for dealing with cravings AEB learning to ride the wave of emotion/sensation/etc instead of seeking negative self-soothing techniques: ie using substances to numb self.  Client to utilize BSP (brainspotting) with therapist to help client identify and process triggers for their MH/ SUD with goal of reducing SUDs by 33% each  session. Client to prioritize sleep 8+ hours each week night AEB going to bed by 10pm each night. Client participated in the treatment planning of their therapy. Client agreed with the plan and understands what to do if there is a crisis: call 9-1-1 and/or crisis line given by therapist.  Barnie Del, LCSW, LCAS, CCTP, CCS-I, BSP

## 2020-01-27 ENCOUNTER — Ambulatory Visit (INDEPENDENT_AMBULATORY_CARE_PROVIDER_SITE_OTHER): Payer: BC Managed Care – PPO | Admitting: Addiction (Substance Use Disorder)

## 2020-01-27 ENCOUNTER — Encounter: Payer: Self-pay | Admitting: Addiction (Substance Use Disorder)

## 2020-01-27 ENCOUNTER — Other Ambulatory Visit: Payer: Self-pay

## 2020-01-27 DIAGNOSIS — F4310 Post-traumatic stress disorder, unspecified: Secondary | ICD-10-CM

## 2020-01-27 NOTE — Progress Notes (Signed)
[facility] Counselor/Therapist Progress Note  Patient ID: Kristin Sandoval, MRN: [id number],    Date: 01/27/2020  Time Spent: 41min  Treatment Type: Individual Therapy  Reported Symptoms: tearful, anxious, overwhelmed.   Mental Status Exam:  Appearance:   Neat and Well Groomed     Behavior:  Sharing  Motor:  Normal  Speech/Language:   Clear and Coherent and Pressured  Affect:  Appropriate and Congruent  Mood:  sad  Thought process:  circumstantial  Thought content:    Obsessions and Rumination  Sensory/Perceptual disturbances:    Flashbacks  Orientation:  x4  Attention:  Good  Concentration:  Fair  Memory:  WNL  Fund of knowledge:   Good  Insight:    Good  Judgment:   Good  Impulse Control:  Good   Risk Assessment: Danger to Self:  No Self-injurious Behavior: No Danger to Others: No Duty to Warn:no Physical Aggression / Violence:No  Access to Firearms a concern: No  Gang Involvement:No   Subjective: Client reported feeling fearful of being alone or leaving a guy who isnt any good for her due to her low self-esteem. Client discussed her stressors along with her dreams for her life. Therapist used MI to support client and BSP with client to help her process the anxiety in her chest & arms. Client made progress by her SUDs decreasing from 8/10 to 1/10 and her thoughts became more hopeful and less distorted. Client feeling more empowered about her recovery and feeling more confidence in her chest where the anxiety used to be. Therapist assessed for safety and sobriety and client denied SI/HI/AVH and denied cravings/triggers/substance use.   Interventions: Motivational Interviewing and RPT  & brainspotting   Diagnosis:   ICD-10-CM   1. PTSD (post-traumatic stress disorder)  F43.10       Plan of Care: Client to return for weekly therapy with Sammuel Cooper, therapist, for outpatient therapy, to review again in 3-6 months. Client is to continue seeing medication  provider for support of mood management, triggers and cravings.   Client to continue engaging in more support for them in their SUD txt: engaging in 12 step meetings. Client to engage in [place] txt and RPT relapse prevention therapy AEB coming to therapy weekly and implementing recovery oriented coping strategies to help reduce use of substances and to find relief from symptoms of MH disorders and trauma that cause them to want to escape from reality and numb their mind&body.  Client also to create more stability & structure AEB goal planning with therapist to assist in helping them get into a healthy rhythm/ schedule that can help to calm their nervous system and begin to build more healthy brain neuropathways.  Client to engage in CBT: challenging negative internal ruminationsand self-talk AEB expressing toxic thoughts and challenging them with truth.  Client to practice DBT distress tolerance skills (such as distress tolerance and emotion regulation) to practice achieving wise mind and to build support for dealing with cravings AEB learning to ride the wave of emotion/sensation/etc instead of seeking negative self-soothing techniques: ie using substances to numb self.  Client to utilize BSP (brainspotting) with therapist to help client identify and process triggers for their MH/ SUD with goal of reducing SUDs by 33% each session. Client to prioritize sleep 8+ hours each week night AEB going to bed by 10pm each night. Client participated in the treatment planning of their therapy. Client agreed with the plan and understands what to do if there is  a crisis: call 9-1-1 and/or crisis line given by therapist.  Barnie Del, LCSW, LCAS, CCTP, CCS-I, BSP

## 2020-02-12 ENCOUNTER — Encounter: Payer: Self-pay | Admitting: Addiction (Substance Use Disorder)

## 2020-02-12 ENCOUNTER — Ambulatory Visit: Payer: BC Managed Care – PPO | Admitting: Psychiatry

## 2020-02-12 ENCOUNTER — Ambulatory Visit (INDEPENDENT_AMBULATORY_CARE_PROVIDER_SITE_OTHER): Payer: BC Managed Care – PPO | Admitting: Adult Health

## 2020-02-12 ENCOUNTER — Ambulatory Visit (INDEPENDENT_AMBULATORY_CARE_PROVIDER_SITE_OTHER): Payer: BC Managed Care – PPO | Admitting: Addiction (Substance Use Disorder)

## 2020-02-12 ENCOUNTER — Encounter: Payer: Self-pay | Admitting: Adult Health

## 2020-02-12 ENCOUNTER — Other Ambulatory Visit: Payer: Self-pay

## 2020-02-12 DIAGNOSIS — F4310 Post-traumatic stress disorder, unspecified: Secondary | ICD-10-CM | POA: Diagnosis not present

## 2020-02-12 DIAGNOSIS — F411 Generalized anxiety disorder: Secondary | ICD-10-CM | POA: Diagnosis not present

## 2020-02-12 DIAGNOSIS — F331 Major depressive disorder, recurrent, moderate: Secondary | ICD-10-CM

## 2020-02-12 DIAGNOSIS — G4700 Insomnia, unspecified: Secondary | ICD-10-CM

## 2020-02-12 DIAGNOSIS — F909 Attention-deficit hyperactivity disorder, unspecified type: Secondary | ICD-10-CM

## 2020-02-12 MED ORDER — DESVENLAFAXINE SUCCINATE ER 25 MG PO TB24: 25.0000 mg | ORAL_TABLET | Freq: Every morning | ORAL | 0 refills | Status: DC

## 2020-02-12 MED ORDER — DESVENLAFAXINE SUCCINATE ER 50 MG PO TB24: 50.0000 mg | ORAL_TABLET | Freq: Every day | ORAL | 5 refills | Status: DC

## 2020-02-12 MED ORDER — QUETIAPINE FUMARATE 25 MG PO TABS: 25.0000 mg | ORAL_TABLET | Freq: Every day | ORAL | 2 refills | Status: DC

## 2020-02-12 MED ORDER — LISDEXAMFETAMINE DIMESYLATE 20 MG PO CAPS: 20.0000 mg | ORAL_CAPSULE | Freq: Every day | ORAL | 0 refills | Status: DC

## 2020-02-12 NOTE — Progress Notes (Signed)
[facility] Counselor/Therapist Progress Note  Patient ID: Kristin Sandoval, MRN: [id number],    Date: 02/12/2020  Time Spent: 57mins  Treatment Type: Individual Therapy  Reported Symptoms: frustrated.  Mental Status Exam:  Appearance:   Casual and Well Groomed     Behavior:  Sharing  Motor:  Normal  Speech/Language:   Clear and Coherent and Pressured  Affect:  Appropriate and Congruent  Mood:  depressed  Thought process:  normal  Thought content:    Obsessions and Rumination  Sensory/Perceptual disturbances:    Flashbacks  Orientation:  x4  Attention:  Good  Concentration:  Fair  Memory:  WNL  Fund of knowledge:   Good  Insight:    Good  Judgment:   Good  Impulse Control:  Good   Risk Assessment: Danger to Self:  No Self-injurious Behavior: No Danger to Others: No Duty to Warn:no Physical Aggression / Violence:No  Access to Firearms a concern: No  Gang Involvement:No   Subjective: Client reported feeling depressed and needing to find another treatment to add in to help her improve. Client reported feeling ongoing anhedonia with things she used to like to do and this discourages her about getting better completely with depression. Therapist used MI & psychoeducation with client to support her and validate her struggle while also helping client to understand more about pleasure thresholds being low after going into recovery. Therapist and client discussed positive events for her to do to help engage those endorphin production. Therapist also referred client to [place] location for more information on that txt for depression. Therapist assessed for safety and sobriety and client denied SI/HI/AVH and denied cravings/triggers/substance use.   Interventions: Motivational Interviewing, Psycho-education/Bibliotherapy and RPT    Diagnosis:   ICD-10-CM   1. PTSD (post-traumatic stress disorder)  F43.10   2. Generalized anxiety disorder  F41.1     Plan of Care: Client to  return for weekly therapy with Sammuel Cooper, therapist, for outpatient therapy, to review again in 3-6 months. Client is to continue seeing medication provider for support of mood management, triggers and cravings.   Client to continue engaging in more support for them in their SUD txt: engaging in 12 step meetings. Client to engage in [place] txt and RPT relapse prevention therapy AEB coming to therapy weekly and implementing recovery oriented coping strategies to help reduce use of substances and to find relief from symptoms of MH disorders and trauma that cause them to want to escape from reality and numb their mind&body.  Client also to create more stability & structure AEB goal planning with therapist to assist in helping them get into a healthy rhythm/ schedule that can help to calm their nervous system and begin to build more healthy brain neuropathways.  Client to engage in CBT: challenging negative internal ruminationsand self-talk AEB expressing toxic thoughts and challenging them with truth.  Client to practice DBT distress tolerance skills (such as distress tolerance and emotion regulation) to practice achieving wise mind and to build support for dealing with cravings AEB learning to ride the wave of emotion/sensation/etc instead of seeking negative self-soothing techniques: ie using substances to numb self.  Client to utilize BSP (brainspotting) with therapist to help client identify and process triggers for their MH/ SUD with goal of reducing SUDs by 33% each session. Client to prioritize sleep 8+ hours each week night AEB going to bed by 10pm each night. Client participated in the treatment planning of their therapy. Client agreed with the  plan and understands what to do if there is a crisis: call 9-1-1 and/or crisis line given by therapist.  Barnie Del, LCSW, LCAS, CCTP, CCS-I, BSP

## 2020-02-12 NOTE — Progress Notes (Signed)
Kristin Sandoval [id number] [birth year] 34 y.o.  Subjective:   Patient ID:  Kristin Sandoval is a 34 y.o. (DOB 08/12/1985) female.  Chief Complaint: No chief complaint on file.   HPI Kristin Sandoval presents to the office today for follow-up of PTSD, GAD, insomnia, MDD, and PTSD.  Describes mood today as "so-so". Tearful at times. Mood symptoms - reports decreased anxiety "that's stable".  Denies irritability. Feels more depressed. Stating "I am parked in neutral". Has no "pleasure" in her life. Usually enjoys music and movies. Thought riding horse would help, but it hasn't. Has no "joy in anything". Maintains sobriety. Varying interest and motivation. Taking medications as prescribed.  Energy levels improved. Active, has started an exercise routine.  Enjoys some usual interests and activities. Single. Dating. Lives alone. Talking with friends. Getting out some. Riding horse.  Appetite adequate. Weight loss - 176 pounds.  Sleeps well most night. Averages 8 hours with Seroquel 25mg  at bedtime.  Focus and concentration stable. Completing tasks. Managing aspects of household. Working outside on farm. Taking one class this fall. Working from home currently.  Denies SI or HI. Denies AH or VH.  Previous medication trials: Wellbutrin - rash, Abilify, Risperdal, Wellbutrin   PHQ2-9     Office Visit from 03/22/2017 in [facility] at [place] Visit from 03/04/2016 in [facility] at [place]  PHQ-2 Total Score 3 0  PHQ-9 Total Score 10 --       Review of Systems:  Review of Systems  Musculoskeletal: Negative for gait problem.  Neurological: Negative for tremors.  Psychiatric/Behavioral:       Please refer to HPI    Medications: I have reviewed the patient's current medications.  Current Outpatient Medications  Medication Sig Dispense Refill  . albuterol (PROAIR HFA) 108 (90 Base) MCG/ACT inhaler INHALE 2 PUFFS INTO THE  LUNGS EVERY 6 (SIX) HOURS AS NEEDED FOR WHEEZING OR SHORTNESS OF BREATH. 8.5 Inhaler 0  . busPIRone (BUSPAR) 10 MG tablet Take 1 tablet (10 mg total) by mouth 3 (three) times daily. 90 tablet 2  . Cholecalciferol (VITAMIN D3) 1.25 MG (50000 UT) CAPS SMARTSIG:1 Capsule(s) By Mouth Once a Week PRN    . desvenlafaxine (PRISTIQ) 50 MG 24 hr tablet Take 1 tablet (50 mg total) by mouth daily. 30 tablet 5  . Desvenlafaxine Succinate ER (PRISTIQ) 25 MG TB24 Take 25 mg by mouth in the morning. 7 tablet 0  . ibuprofen (ADVIL,MOTRIN) 600 MG tablet 1 tab po q8h prn at onset of headache 30 tablet 3  . lisdexamfetamine (VYVANSE) 20 MG capsule Take 1 capsule (20 mg total) by mouth daily. 90 capsule 0  . lurasidone (LATUDA) 20 MG TABS tablet Take 1 tablet (20 mg total) by mouth daily. 30 tablet 2  . metroNIDAZOLE (METROCREAM) 0.75 % cream APPLY TO AFFECTED AREA EVERY DAY AS NEEDED    . QUEtiapine (SEROQUEL) 25 MG tablet Take 1 tablet (25 mg total) by mouth at bedtime. 30 tablet 2  . tretinoin (RETIN-A) 0.025 % cream APPLY TO AFFECTED AREA EVERY DAY AT BEDTIME    . tretinoin (RETIN-A) 0.05 % cream Apply topically at bedtime. 45 g 0  . valACYclovir (VALTREX) 1000 MG tablet      No current facility-administered medications for this visit.    Medication Side Effects: None  Allergies:  Allergies  Allergen Reactions  . Apple Shortness Of Breath  . Acrylic Polymer [Carbomer]     Loses nail  .  Latex     Latex "mild skin irritation"  . Other Other (See Comments)    STRAW / HAY.  SEVERE  ITCHING, CONGESTION.  . Adhesive [Tape] Rash  . Mango Flavor Rash    Past Medical History:  Diagnosis Date  . ADHD (attention deficit hyperactivity disorder)    Adderall used  . Asthma    childhood  . Complication of anesthesia    some difficulty awakening  . Headache    migraines in past- none recent  . Skin cancer    basal cell carcinoma    Family History  Problem Relation Age of Onset  . Alcohol abuse  Father   . Arthritis Maternal Uncle   . Hyperlipidemia Maternal Uncle   . Alcohol abuse Paternal Aunt   . Arthritis Maternal Grandmother        DDD, spinal stenosis  . Hyperlipidemia Maternal Grandmother   . Alcohol abuse Maternal Grandfather   . Alcohol abuse Paternal Grandfather     Social History   Socioeconomic History  . Marital status: Single    Spouse name: Not on file  . Number of children: 0  . Years of education: Not on file  . Highest education level: Not on file  Occupational History    Employer: [employer]  Tobacco Use  . Smoking status: Former Smoker    Types: Cigarettes    Quit date: 06/30/2006    Years since quitting: 13.6  . Smokeless tobacco: Never Used  Substance and Sexual Activity  . Alcohol use: Yes    Comment: 1-3 drinks weekly  . Drug use: No  . Sexual activity: Yes    Birth control/protection: Pill  Other Topics Concern  . Not on file  Social History Narrative   Caffeine Use:  1 cup coffee daily   Regular exercise:  4 x weekly         Social Determinants of Health   Financial Resource Strain:   . Difficulty of Paying Living Expenses: Not on file  Food Insecurity:   . Worried About Charity fundraiser in the Last Year: Not on file  . Ran Out of Food in the Last Year: Not on file  Transportation Needs:   . Lack of Transportation (Medical): Not on file  . Lack of Transportation (Non-Medical): Not on file  Physical Activity:   . Days of Exercise per Week: Not on file  . Minutes of Exercise per Session: Not on file  Stress:   . Feeling of Stress : Not on file  Social Connections:   . Frequency of Communication with Friends and Family: Not on file  . Frequency of Social Gatherings with Friends and Family: Not on file  . Attends Religious Services: Not on file  . Active Member of Clubs or Organizations: Not on file  . Attends Archivist Meetings: Not on file  . Marital Status: Not on file  Intimate Partner Violence:   . Fear of  Current or Ex-Partner: Not on file  . Emotionally Abused: Not on file  . Physically Abused: Not on file  . Sexually Abused: Not on file    Past Medical History, Surgical history, Social history, and Family history were reviewed and updated as appropriate.   Please see review of systems for further details on the patient's review from today.   Objective:   Physical Exam:  There were no vitals taken for this visit.  Physical Exam Constitutional:      General: She is not  in acute distress. Musculoskeletal:        General: No deformity.  Neurological:     Mental Status: She is alert and oriented to person, place, and time.     Coordination: Coordination normal.  Psychiatric:        Attention and Perception: Attention and perception normal. She does not perceive auditory or visual hallucinations.        Mood and Affect: Mood normal. Mood is not anxious or depressed. Affect is not labile, blunt, angry or inappropriate.        Speech: Speech normal.        Behavior: Behavior normal.        Thought Content: Thought content normal. Thought content is not paranoid or delusional. Thought content does not include homicidal or suicidal ideation. Thought content does not include homicidal or suicidal plan.        Cognition and Memory: Cognition and memory normal.        Judgment: Judgment normal.     Comments: Insight intact     Lab Review:     Component Value Date/Time   NA 135 06/09/2017 1135   K 4.0 06/09/2017 1135   CL 101 06/09/2017 1135   CO2 28 06/09/2017 1135   GLUCOSE 83 06/09/2017 1135   BUN 12 06/09/2017 1135   CREATININE 0.88 06/09/2017 1135   CREATININE 0.80 05/10/2013 1451   CALCIUM 8.8 06/09/2017 1135   PROT 7.9 03/22/2017 1253   ALBUMIN 4.6 03/22/2017 1253   AST 10 03/22/2017 1253   ALT 10 03/22/2017 1253   ALKPHOS 55 03/22/2017 1253   BILITOT 0.7 03/22/2017 1253   GFRNONAA >89 05/10/2013 1451   GFRAA >89 05/10/2013 1451       Component Value Date/Time    WBC 7.0 03/22/2017 1253   RBC 4.58 03/22/2017 1253   HGB 13.8 03/22/2017 1253   HCT 41.5 03/22/2017 1253   PLT 276.0 03/22/2017 1253   MCV 90.7 03/22/2017 1253   MCH 29.5 11/29/2016 1622   MCHC 33.2 03/22/2017 1253   RDW 13.5 03/22/2017 1253   LYMPHSABS 2.3 03/22/2017 1253   MONOABS 0.6 03/22/2017 1253   EOSABS 0.1 03/22/2017 1253   BASOSABS 0.0 03/22/2017 1253    No results found for: POCLITH, LITHIUM   No results found for: PHENYTOIN, PHENOBARB, VALPROATE, CBMZ   .res Assessment: Plan:    Plan:  PDMP reviewed  Latuda 20mg  daily Seroquel 25mg  at hs Vyvanse 20mg  daily   Add Pristiq - 25mg  daily x 7 days, then 50mg  daily.  RTC 4 weeks  Patient advised to contact office with any questions, adverse effects, or acute worsening in signs and symptoms.  Discussed potential metabolic side effects associated with atypical antipsychotics, as well as potential risk for movement side effects. Advised pt to contact office if movement side effects occur.   Discussed potential benefits, risks, and side effects of stimulants with patient to include increased heart rate, palpitations, insomnia, increased anxiety, increased irritability, or decreased appetite.  Instructed patient to contact office if experiencing any significant tolerability issues.      Diagnoses and all orders for this visit:  PTSD (post-traumatic stress disorder)  Generalized anxiety disorder -     Desvenlafaxine Succinate ER (PRISTIQ) 25 MG TB24; Take 25 mg by mouth in the morning. -     desvenlafaxine (PRISTIQ) 50 MG 24 hr tablet; Take 1 tablet (50 mg total) by mouth daily.  Attention deficit hyperactivity disorder (ADHD), unspecified ADHD type -  lisdexamfetamine (VYVANSE) 20 MG capsule; Take 1 capsule (20 mg total) by mouth daily.  Insomnia, unspecified type -     QUEtiapine (SEROQUEL) 25 MG tablet; Take 1 tablet (25 mg total) by mouth at bedtime.  Major depressive disorder, recurrent episode,  moderate (HCC) -     Desvenlafaxine Succinate ER (PRISTIQ) 25 MG TB24; Take 25 mg by mouth in the morning. -     desvenlafaxine (PRISTIQ) 50 MG 24 hr tablet; Take 1 tablet (50 mg total) by mouth daily.     Please see After Visit Summary for patient specific instructions.  Future Appointments  Date Time Provider [place]  02/12/2020  3:20 PM Amani Marseille, Berdie Ogren, NP [facility] None  02/26/2020  2:00 PM Barnie Del, LCSW [facility] None  03/11/2020  2:00 PM Barnie Del, LCSW [facility] None  03/25/2020  2:00 PM Barnie Del, LCSW [facility] None    No orders of the defined types were placed in this encounter.   -------------------------------

## 2020-02-26 ENCOUNTER — Ambulatory Visit: Payer: BC Managed Care – PPO | Admitting: Addiction (Substance Use Disorder)

## 2020-03-11 ENCOUNTER — Ambulatory Visit (INDEPENDENT_AMBULATORY_CARE_PROVIDER_SITE_OTHER): Payer: BC Managed Care – PPO | Admitting: Addiction (Substance Use Disorder)

## 2020-03-11 ENCOUNTER — Other Ambulatory Visit: Payer: Self-pay

## 2020-03-11 ENCOUNTER — Encounter: Payer: Self-pay | Admitting: Addiction (Substance Use Disorder)

## 2020-03-11 DIAGNOSIS — F4310 Post-traumatic stress disorder, unspecified: Secondary | ICD-10-CM

## 2020-03-11 NOTE — Progress Notes (Signed)
[facility] Counselor/Therapist Progress Note  Patient ID: Kristin Sandoval, MRN: [id number],    Date: 03/11/2020  Time Spent: 65mins  Treatment Type: Individual Therapy  Reported Symptoms: relieved, mellow.   Mental Status Exam:  Appearance:   Casual and Well Groomed     Behavior:  Appropriate and Sharing  Motor:  Normal  Speech/Language:   Clear and Coherent and Normal Rate  Affect:  Appropriate and Congruent  Mood:  sad  Thought process:  normal  Thought content:    Obsessions and Rumination  Sensory/Perceptual disturbances:    Flashbacks  Orientation:  x4  Attention:  Good  Concentration:  Fair  Memory:  WNL  Fund of knowledge:   Good  Insight:    Good  Judgment:   Good  Impulse Control:  Good   Risk Assessment: Danger to Self:  No Self-injurious Behavior: No Danger to Others: No Duty to Warn:no Physical Aggression / Violence:No  Access to Firearms a concern: No  Gang Involvement:No   Subjective: Client reported feeling more relieved anxiety wise but still depressed & frustrated on the inside (in her heart). Therapist used BSP & mindfulness to help client process the SUDs in her heart to find hope, some relief. Client made progress in session and discussed her motivation for staying sober. Client reported feeling more stable mentally taking a new medication for depression. Client reported struggling with cravings due to upcoming bachelorette weekend and friend who is sabotaging her. Therapist assessed for safety and sobriety and encouraged client using RPT with client to help her find ways to play the tape out and engage in coping skills to help her stay sober. Client denied SI/HI/AVH and denied substance use reporting a plan for sobriety, including using the 12 steps.   Interventions: Mindfulness Meditation, Motivational Interviewing, 12-Step and RPT    Diagnosis:   ICD-10-CM   1. PTSD (post-traumatic stress disorder)  F43.10     Plan of Care: Client to  return for weekly therapy with Sammuel Cooper, therapist, for outpatient therapy, to review again in 3-6 months. Client is to continue seeing medication provider for support of mood management, triggers and cravings.   Client to continue engaging in more support for them in their SUD txt: engaging in 12 step meetings. Client to engage in [place] txt and RPT relapse prevention therapy AEB coming to therapy weekly and implementing recovery oriented coping strategies to help reduce use of substances and to find relief from symptoms of MH disorders and trauma that cause them to want to escape from reality and numb their mind&body.  Client also to create more stability & structure AEB goal planning with therapist to assist in helping them get into a healthy rhythm/ schedule that can help to calm their nervous system and begin to build more healthy brain neuropathways.  Client to engage in CBT: challenging negative internal ruminationsand self-talk AEB expressing toxic thoughts and challenging them with truth.  Client to practice DBT distress tolerance skills (such as distress tolerance and emotion regulation) to practice achieving wise mind and to build support for dealing with cravings AEB learning to ride the wave of emotion/sensation/etc instead of seeking negative self-soothing techniques: ie using substances to numb self.  Client to utilize BSP (brainspotting) with therapist to help client identify and process triggers for their MH/ SUD with goal of reducing SUDs by 33% each session. Client to prioritize sleep 8+ hours each week night AEB going to bed by 10pm each night. Client participated  in the treatment planning of their therapy. Client agreed with the plan and understands what to do if there is a crisis: call 9-1-1 and/or crisis line given by therapist.  Barnie Del, LCSW, LCAS, CCTP, CCS-I, BSP

## 2020-03-25 ENCOUNTER — Ambulatory Visit (INDEPENDENT_AMBULATORY_CARE_PROVIDER_SITE_OTHER): Payer: BC Managed Care – PPO | Admitting: Addiction (Substance Use Disorder)

## 2020-03-25 ENCOUNTER — Encounter: Payer: Self-pay | Admitting: Addiction (Substance Use Disorder)

## 2020-03-25 DIAGNOSIS — F1021 Alcohol dependence, in remission: Secondary | ICD-10-CM

## 2020-03-25 DIAGNOSIS — F4310 Post-traumatic stress disorder, unspecified: Secondary | ICD-10-CM

## 2020-03-25 NOTE — Progress Notes (Signed)
[facility] Counselor/Therapist Progress Note  Patient ID: Kristin Sandoval, MRN: [id number],    Date: 03/25/2020  Time Spent: 30mins  Treatment Type: Individual Therapy  Reported Symptoms: positive, hopeful.   Mental Status Exam:  Appearance:   Neat and Well Groomed     Behavior:  Appropriate and Sharing  Motor:  Normal  Speech/Language:   Clear and Coherent and Normal Rate  Affect:  Appropriate and Congruent  Mood:  normal  Thought process:  normal  Thought content:    Obsessions and Rumination  Sensory/Perceptual disturbances:    Flashbacks  Orientation:  x4  Attention:  Good  Concentration:  Good  Memory:  WNL  Fund of knowledge:   Good  Insight:    Good  Judgment:   Good  Impulse Control:  Good   Risk Assessment: Danger to Self:  No Self-injurious Behavior: No Danger to Others: No Duty to Warn:no Physical Aggression / Violence:No  Access to Firearms a concern: No  Gang Involvement:No   Virtual Visit via VIDEO: I connected with client by MyChart video enabled telemedicine/telehealth application, with their informed consent, and verified client privacy and that I am speaking with the correct person using two identifiers. I discussed the limitations, risks, security and privacy concerns of performing psychotherapy and management service virtually and confirmed their location. I also discussed with the patient that there may be a patient responsible charge related to this service and to confirm with the front desk if their insurance covers teletherapy. I also discussed with the patient the availability of in person appointments. The patient expressed understanding and agreed to proceed. I discussed the treatment planning with the client. The client was provided an opportunity to ask questions and all were answered. The client agreed with the plan and demonstrated an understanding of the instructions. The client was advised to call our office if symptoms worsen or feel  they are in a crisis state and need immediate contact. Client also reminded of a crisis line number and to use 9-1-1 if there's an emergency.  Therapist Location: office; Client Location: home.  Subjective: Client reported feeling more positive, hopeful. Client described some stressors she had had about her friend's bachelorette  Party and temptations that God took care of for her. Therapist used MI & RPT with client to support her in processing ways to support herself in her recovery and continuing positive self-care that doesn't numb her like drinking used to. Client processed more positive outcomes in her life at work and within herself with her depression lifting. Therapsit used MI with client to affirm her strengths and celebrate her progress. Therapist assessed and Client denied SI/HI/AVH and denied substance use or cravings.   Interventions: Motivational Interviewing and RPT    Diagnosis:   ICD-10-CM   1. PTSD (post-traumatic stress disorder)  F43.10   2. Alcohol use disorder, moderate, in sustained remission ([place])  F10.21     Plan of Care: Client to return for weekly therapy with Sammuel Cooper, therapist, for outpatient therapy, to review again in 3-6 months. Client is to continue seeing medication provider for support of mood management, triggers and cravings.   Client to continue engaging in more support for them in their SUD txt: engaging in 12 step meetings. Client to engage in [place] txt and RPT relapse prevention therapy AEB coming to therapy weekly and implementing recovery oriented coping strategies to help reduce use of substances and to find relief from symptoms of MH disorders and trauma  that cause them to want to escape from reality and numb their mind&body.  Client also to create more stability & structure AEB goal planning with therapist to assist in helping them get into a healthy rhythm/ schedule that can help to calm their nervous system and begin to build more healthy brain  neuropathways.  Client to engage in CBT: challenging negative internal ruminationsand self-talk AEB expressing toxic thoughts and challenging them with truth.  Client to practice DBT distress tolerance skills (such as distress tolerance and emotion regulation) to practice achieving wise mind and to build support for dealing with cravings AEB learning to ride the wave of emotion/sensation/etc instead of seeking negative self-soothing techniques: ie using substances to numb self.  Client to utilize BSP (brainspotting) with therapist to help client identify and process triggers for their MH/ SUD with goal of reducing SUDs by 33% each session. Client to prioritize sleep 8+ hours each week night AEB going to bed by 10pm each night. Client participated in the treatment planning of their therapy. Client agreed with the plan and understands what to do if there is a crisis: call 9-1-1 and/or crisis line given by therapist.  Barnie Del, LCSW, LCAS, CCTP, CCS-I, BSP

## 2020-04-08 ENCOUNTER — Encounter: Payer: Self-pay | Admitting: Addiction (Substance Use Disorder)

## 2020-04-08 ENCOUNTER — Encounter: Payer: Self-pay | Admitting: Psychiatry

## 2020-04-08 ENCOUNTER — Ambulatory Visit (INDEPENDENT_AMBULATORY_CARE_PROVIDER_SITE_OTHER): Payer: BC Managed Care – PPO | Admitting: Addiction (Substance Use Disorder)

## 2020-04-08 DIAGNOSIS — F4310 Post-traumatic stress disorder, unspecified: Secondary | ICD-10-CM | POA: Diagnosis not present

## 2020-04-08 DIAGNOSIS — F1021 Alcohol dependence, in remission: Secondary | ICD-10-CM | POA: Diagnosis not present

## 2020-04-08 NOTE — Progress Notes (Signed)
[facility] Counselor/Therapist Progress Note  Patient ID: Kristin Sandoval, MRN: [id number],    Date: 04/08/2020  Time Spent: 41mins  Treatment Type: Individual Therapy  Reported Symptoms: curious, proud.   Mental Status Exam:  Appearance:   Casual and Well Groomed     Behavior:  Appropriate and Sharing  Motor:  Normal  Speech/Language:   Clear and Coherent and Normal Rate  Affect:  Appropriate and Congruent  Mood:  normal  Thought process:  normal  Thought content:    Obsessions and Rumination  Sensory/Perceptual disturbances:    Flashbacks  Orientation:  x4  Attention:  Good  Concentration:  Good  Memory:  WNL  Fund of knowledge:   Good  Insight:    Good  Judgment:   Good  Impulse Control:  Good   Risk Assessment: Danger to Self:  No Self-injurious Behavior: No Danger to Others: No Duty to Warn:no Physical Aggression / Violence:No  Access to Firearms a concern: No  Gang Involvement:No   Virtual Visit via VIDEO: I connected with client by MyChart video enabled telemedicine/telehealth application, with their informed consent, and verified client privacy and that I am speaking with the correct person using two identifiers. I discussed the limitations, risks, security and privacy concerns of performing psychotherapy and management service virtually and confirmed their location. I also discussed with the patient that there may be a patient responsible charge related to this service and to confirm with the front desk if their insurance covers teletherapy. I also discussed with the patient the availability of in person appointments. The patient expressed understanding and agreed to proceed. I discussed the treatment planning with the client. The client was provided an opportunity to ask questions and all were answered. The client agreed with the plan and demonstrated an understanding of the instructions. The client was advised to call our office if symptoms worsen or feel  they are in a crisis state and need immediate contact. Client also reminded of a crisis line number and to use 9-1-1 if there's an emergency.  Therapist Location: office; Client Location: home. Subjective: Client reported progress in her recovery including being proud of her reactions to alcohol around her during the bachelorette weekend. Client discussed more of her somatic sensations that lead to cravings and therapist used MI, RPT & mindfulness to support client as she processes while also helping client consider coping skills to put in place for safety of her sobriety. Therapist assessed for safety and sobriety thus far and client denied SI/HI/AVH and sobriety since inpatient txt while also having triggers but less cravings overall. Client discussed more of the somatic sensations that can lead to cravings and processed root connections she has to memories involving alcohol to gain awareness and to discharge the emotion and ground from them.   Interventions: Mindfulness Meditation, Motivational Interviewing and RPT    Diagnosis:   ICD-10-CM   1. PTSD (post-traumatic stress disorder)  F43.10   2. Alcohol use disorder, moderate, in sustained remission ([place])  F10.21     Plan of Care: Client to return for weekly therapy with Sammuel Cooper, therapist, for outpatient therapy, to review again in 3-6 months. Client is to continue seeing medication provider for support of mood management, triggers and cravings.   Client to continue engaging in more support for them in their SUD txt: engaging in 12 step meetings. Client to engage in [place] txt and RPT relapse prevention therapy AEB coming to therapy weekly and implementing recovery oriented  coping strategies to help reduce use of substances and to find relief from symptoms of MH disorders and trauma that cause them to want to escape from reality and numb their mind&body.  Client also to create more stability & structure AEB goal planning with therapist to assist  in helping them get into a healthy rhythm/ schedule that can help to calm their nervous system and begin to build more healthy brain neuropathways.  Client to engage in CBT: challenging negative internal ruminationsand self-talk AEB expressing toxic thoughts and challenging them with truth.  Client to practice DBT distress tolerance skills (such as distress tolerance and emotion regulation) to practice achieving wise mind and to build support for dealing with cravings AEB learning to ride the wave of emotion/sensation/etc instead of seeking negative self-soothing techniques: ie using substances to numb self.  Client to utilize BSP (brainspotting) with therapist to help client identify and process triggers for their MH/ SUD with goal of reducing SUDs by 33% each session. Client to prioritize sleep 8+ hours each week night AEB going to bed by 10pm each night. Client participated in the treatment planning of their therapy. Client agreed with the plan and understands what to do if there is a crisis: call 9-1-1 and/or crisis line given by therapist.  Barnie Del, LCSW, LCAS, CCTP, CCS-I, BSP

## 2020-04-22 ENCOUNTER — Ambulatory Visit: Payer: BC Managed Care – PPO | Admitting: Addiction (Substance Use Disorder)

## 2020-05-11 ENCOUNTER — Ambulatory Visit: Payer: BC Managed Care – PPO | Admitting: Addiction (Substance Use Disorder)

## 2020-05-12 ENCOUNTER — Other Ambulatory Visit: Payer: Self-pay | Admitting: Adult Health

## 2020-05-12 DIAGNOSIS — F411 Generalized anxiety disorder: Secondary | ICD-10-CM

## 2020-05-12 DIAGNOSIS — F331 Major depressive disorder, recurrent, moderate: Secondary | ICD-10-CM

## 2020-07-09 ENCOUNTER — Other Ambulatory Visit: Payer: Self-pay | Admitting: Adult Health

## 2020-07-09 DIAGNOSIS — F331 Major depressive disorder, recurrent, moderate: Secondary | ICD-10-CM

## 2020-07-09 DIAGNOSIS — G4700 Insomnia, unspecified: Secondary | ICD-10-CM

## 2020-08-10 ENCOUNTER — Telehealth: Payer: Self-pay | Admitting: Adult Health

## 2020-08-10 ENCOUNTER — Other Ambulatory Visit: Payer: Self-pay | Admitting: Adult Health

## 2020-08-10 DIAGNOSIS — F909 Attention-deficit hyperactivity disorder, unspecified type: Secondary | ICD-10-CM

## 2020-08-10 MED ORDER — LISDEXAMFETAMINE DIMESYLATE 20 MG PO CAPS: 20.0000 mg | ORAL_CAPSULE | Freq: Every day | ORAL | 0 refills | Status: DC

## 2020-08-10 NOTE — Telephone Encounter (Signed)
Script sent  

## 2020-08-10 NOTE — Telephone Encounter (Signed)
Patient called in today needing refill for Vyvanse. She has an appt on 4/1. Pharmacy [facility] in [place]

## 2020-09-18 ENCOUNTER — Other Ambulatory Visit: Payer: Self-pay

## 2020-09-18 ENCOUNTER — Encounter: Payer: Self-pay | Admitting: Adult Health

## 2020-09-18 ENCOUNTER — Ambulatory Visit (INDEPENDENT_AMBULATORY_CARE_PROVIDER_SITE_OTHER): Payer: 59 | Admitting: Adult Health

## 2020-09-18 DIAGNOSIS — G4700 Insomnia, unspecified: Secondary | ICD-10-CM

## 2020-09-18 DIAGNOSIS — F909 Attention-deficit hyperactivity disorder, unspecified type: Secondary | ICD-10-CM | POA: Diagnosis not present

## 2020-09-18 DIAGNOSIS — F4310 Post-traumatic stress disorder, unspecified: Secondary | ICD-10-CM

## 2020-09-18 DIAGNOSIS — F411 Generalized anxiety disorder: Secondary | ICD-10-CM | POA: Diagnosis not present

## 2020-09-18 DIAGNOSIS — F1010 Alcohol abuse, uncomplicated: Secondary | ICD-10-CM

## 2020-09-18 MED ORDER — TEMAZEPAM 15 MG PO CAPS: 15.0000 mg | ORAL_CAPSULE | Freq: Every evening | ORAL | 2 refills | Status: DC | PRN

## 2020-09-18 NOTE — Progress Notes (Signed)
Kristin Sandoval [id number] 04-05-1986 35 y.o.  Subjective:   Patient ID:  Kristin Sandoval is a 35 y.o. (DOB Mar 24, 1986) female.  Chief Complaint: No chief complaint on file.   HPI MIKAILI FLIPPIN presents to the office today for follow-up of PTSD, GAD, insomnia, MDD, and PTSD.  Describes mood today as "ok". Pleasant. Mood symptoms - denies depression, anxiety, or irritability. Stating "I'm doing alright". Feels like medications are working to manage symptoms. Reports some issues with sleep.Maintains sobriety. Varying interest and motivation. Taking medications as prescribed.  Energy levels stable. Active, does not have a regular exercise routine. Working on her farm. Enjoys some usual interests and activities. Single. Dating. Lives alone. Talking with friends. Getting out some. Riding horse.  Appetite adequate. Weight stable - 180 pounds.  Sleeps well most night. Averages 8 hours. Focus and concentration stable. Completing tasks. Managing aspects of household. Working full time. Finishing bachelor's in IT.  Denies SI or HI.  Denies AH or VH.  Previous medication trials: Wellbutrin - rash, Abilify, Risperdal, Wellbutrin   PHQ2-9   Flowsheet Row Office Visit from 03/22/2017 in [facility] [place] Visit from 03/04/2016 in [facility] [facility] [place]  PHQ-2 Total Score 3 0  PHQ-9 Total Score 10 --       Review of Systems:  Review of Systems  Musculoskeletal: Negative for gait problem.  Neurological: Negative for tremors.  Psychiatric/Behavioral:       Please refer to HPI    Medications: I have reviewed the patient's current medications.  Current Outpatient Medications  Medication Sig Dispense Refill  . temazepam (RESTORIL) 15 MG capsule Take 1 capsule (15 mg total) by mouth at bedtime as needed for sleep. 30 capsule 2  . albuterol (PROAIR HFA) 108 (90 Base) MCG/ACT inhaler INHALE 2 PUFFS INTO THE LUNGS EVERY 6  (SIX) HOURS AS NEEDED FOR WHEEZING OR SHORTNESS OF BREATH. 8.5 Inhaler 0  . busPIRone (BUSPAR) 10 MG tablet Take 1 tablet (10 mg total) by mouth 3 (three) times daily. 90 tablet 2  . Cholecalciferol (VITAMIN D3) 1.25 MG (50000 UT) CAPS SMARTSIG:1 Capsule(s) By Mouth Once a Week PRN    . desvenlafaxine (PRISTIQ) 50 MG 24 hr tablet TAKE 1 TABLET BY MOUTH EVERY DAY 90 tablet 2  . Desvenlafaxine Succinate ER (PRISTIQ) 25 MG TB24 Take 25 mg by mouth in the morning. 7 tablet 0  . ibuprofen (ADVIL,MOTRIN) 600 MG tablet 1 tab po q8h prn at onset of headache 30 tablet 3  . LATUDA 20 MG TABS tablet TAKE 1 TABLET BY MOUTH EVERY DAY 30 tablet 2  . lisdexamfetamine (VYVANSE) 20 MG capsule Take 1 capsule (20 mg total) by mouth daily. 90 capsule 0  . metroNIDAZOLE (METROCREAM) 0.75 % cream APPLY TO AFFECTED AREA EVERY DAY AS NEEDED    . tretinoin (RETIN-A) 0.025 % cream APPLY TO AFFECTED AREA EVERY DAY AT BEDTIME    . tretinoin (RETIN-A) 0.05 % cream Apply topically at bedtime. 45 g 0  . valACYclovir (VALTREX) 1000 MG tablet      No current facility-administered medications for this visit.    Medication Side Effects: None  Allergies:  Allergies  Allergen Reactions  . Apple Shortness Of Breath  . Acrylic Polymer [Carbomer]     Loses nail  . Latex     Latex "mild skin irritation"  . Other Other (See Comments)    STRAW / HAY.  SEVERE  ITCHING, CONGESTION.  Marland Kitchen  Adhesive [Tape] Rash  . Mango Flavor Rash    Past Medical History:  Diagnosis Date  . ADHD (attention deficit hyperactivity disorder)    Adderall used  . Asthma    childhood  . Complication of anesthesia    some difficulty awakening  . Headache    migraines in past- none recent  . Skin cancer    basal cell carcinoma    Family History  Problem Relation Age of Onset  . Alcohol abuse Father   . Arthritis Maternal Uncle   . Hyperlipidemia Maternal Uncle   . Alcohol abuse Paternal Aunt   . Arthritis Maternal Grandmother        DDD,  spinal stenosis  . Hyperlipidemia Maternal Grandmother   . Alcohol abuse Maternal Grandfather   . Alcohol abuse Paternal Grandfather     Social History   Socioeconomic History  . Marital status: Single    Spouse name: Not on file  . Number of children: 0  . Years of education: Not on file  . Highest education level: Not on file  Occupational History    Employer: [employer]  Tobacco Use  . Smoking status: Former Smoker    Types: Cigarettes    Quit date: 06/30/2006    Years since quitting: 14.2  . Smokeless tobacco: Never Used  Substance and Sexual Activity  . Alcohol use: Yes    Comment: 1-3 drinks weekly  . Drug use: No  . Sexual activity: Yes    Birth control/protection: Pill  Other Topics Concern  . Not on file  Social History Narrative   Caffeine Use:  1 cup coffee daily   Regular exercise:  4 x weekly         Social Determinants of Health   Financial Resource Strain: Not on file  Food Insecurity: Not on file  Transportation Needs: Not on file  Physical Activity: Not on file  Stress: Not on file  Social Connections: Not on file  Intimate Partner Violence: Not on file    Past Medical History, Surgical history, Social history, and Family history were reviewed and updated as appropriate.   Please see review of systems for further details on the patient's review from today.   Objective:   Physical Exam:  There were no vitals taken for this visit.  Physical Exam Constitutional:      General: She is not in acute distress. Musculoskeletal:        General: No deformity.  Neurological:     Mental Status: She is alert and oriented to person, place, and time.     Coordination: Coordination normal.  Psychiatric:        Attention and Perception: Attention and perception normal. She does not perceive auditory or visual hallucinations.        Mood and Affect: Mood normal. Mood is not anxious or depressed. Affect is not labile, blunt, angry or inappropriate.         Speech: Speech normal.        Behavior: Behavior normal.        Thought Content: Thought content normal. Thought content is not paranoid or delusional. Thought content does not include homicidal or suicidal ideation. Thought content does not include homicidal or suicidal plan.        Cognition and Memory: Cognition and memory normal.        Judgment: Judgment normal.     Comments: Insight intact    Lab Review:     Component Value Date/Time  NA 135 06/09/2017 1135   K 4.0 06/09/2017 1135   CL 101 06/09/2017 1135   CO2 28 06/09/2017 1135   GLUCOSE 83 06/09/2017 1135   BUN 12 06/09/2017 1135   CREATININE 0.88 06/09/2017 1135   CREATININE 0.80 05/10/2013 1451   CALCIUM 8.8 06/09/2017 1135   PROT 7.9 03/22/2017 1253   ALBUMIN 4.6 03/22/2017 1253   AST 10 03/22/2017 1253   ALT 10 03/22/2017 1253   ALKPHOS 55 03/22/2017 1253   BILITOT 0.7 03/22/2017 1253   GFRNONAA >89 05/10/2013 1451   GFRAA >89 05/10/2013 1451       Component Value Date/Time   WBC 7.0 03/22/2017 1253   RBC 4.58 03/22/2017 1253   HGB 13.8 03/22/2017 1253   HCT 41.5 03/22/2017 1253   PLT 276.0 03/22/2017 1253   MCV 90.7 03/22/2017 1253   MCH 29.5 11/29/2016 1622   MCHC 33.2 03/22/2017 1253   RDW 13.5 03/22/2017 1253   LYMPHSABS 2.3 03/22/2017 1253   MONOABS 0.6 03/22/2017 1253   EOSABS 0.1 03/22/2017 1253   BASOSABS 0.0 03/22/2017 1253    No results found for: POCLITH, LITHIUM   No results found for: PHENYTOIN, PHENOBARB, VALPROATE, CBMZ   .res Assessment: Plan:    Plan:  PDMP reviewed  Latuda 20mg  daily D/C Seroquel 25mg  at hs Vyvanse 20mg  daily  Pristiq 50mg  daily Restoril 15mg  at hs   Buspar 10mg  TID prn  RTC 3 months  Patient advised to contact office with any questions, adverse effects, or acute worsening in signs and symptoms.  Discussed potential metabolic side effects associated with atypical antipsychotics, as well as potential risk for movement side effects. Advised pt to  contact office if movement side effects occur.   Discussed potential benefits, risks, and side effects of stimulants with patient to include increased heart rate, palpitations, insomnia, increased anxiety, increased irritability, or decreased appetite.  Instructed patient to contact office if experiencing any significant tolerability issues.    Diagnoses and all orders for this visit:  PTSD (post-traumatic stress disorder)  Attention deficit hyperactivity disorder (ADHD), unspecified ADHD type -     temazepam (RESTORIL) 15 MG capsule; Take 1 capsule (15 mg total) by mouth at bedtime as needed for sleep.  Generalized anxiety disorder  Insomnia, unspecified type  Alcohol abuse     Please see After Visit Summary for patient specific instructions.  No future appointments.  No orders of the defined types were placed in this encounter.   -------------------------------

## 2020-10-13 ENCOUNTER — Other Ambulatory Visit: Payer: Self-pay | Admitting: Adult Health

## 2020-10-13 DIAGNOSIS — G4700 Insomnia, unspecified: Secondary | ICD-10-CM

## 2020-11-11 ENCOUNTER — Encounter: Payer: Self-pay | Admitting: Adult Health

## 2020-11-11 ENCOUNTER — Telehealth: Payer: Self-pay | Admitting: Adult Health

## 2020-11-11 NOTE — Telephone Encounter (Signed)
She can pay cash it's very inexpensive.

## 2020-11-11 NOTE — Telephone Encounter (Signed)
Pease call to se what she is needing. She has taken in the past. May need for sleep.

## 2020-11-11 NOTE — Telephone Encounter (Signed)
Pt LM on VM asking for Rx Seroquel for 60 days. [phone] pt contact #

## 2020-11-11 NOTE — Telephone Encounter (Signed)
Pt stated her insurance will only cover a 15 day supply of the Restoril so that's why she was trying to go back on Seroquel.she is out of Restoril and needs something for sleep.I mentioned that she might need a PA but she said that doxepin worked better for her in the past and she would rather go back on it then the Seroquel.please advise

## 2020-11-11 NOTE — Telephone Encounter (Signed)
I dont see this on the med list ?

## 2020-11-11 NOTE — Telephone Encounter (Signed)
Yes it's very cheap. No reason to send a prior authorization. All insurances only cover #15 for some reason

## 2020-11-12 ENCOUNTER — Other Ambulatory Visit: Payer: Self-pay | Admitting: Adult Health

## 2020-11-12 DIAGNOSIS — G4700 Insomnia, unspecified: Secondary | ICD-10-CM

## 2020-11-12 MED ORDER — DOXEPIN HCL 10 MG PO CAPS: 10.0000 mg | ORAL_CAPSULE | Freq: Every day | ORAL | 2 refills | Status: DC

## 2020-11-12 NOTE — Telephone Encounter (Signed)
Yes she stated she would rather go back on that medication.

## 2020-11-12 NOTE — Telephone Encounter (Signed)
She still would like to try doxepin

## 2020-11-12 NOTE — Telephone Encounter (Signed)
Instead of the Temazepam?

## 2020-11-12 NOTE — Telephone Encounter (Signed)
Script sent for Doxepin 10mg  at hs.

## 2020-12-15 ENCOUNTER — Telehealth: Payer: Self-pay | Admitting: Adult Health

## 2020-12-15 ENCOUNTER — Other Ambulatory Visit: Payer: Self-pay

## 2020-12-15 DIAGNOSIS — F909 Attention-deficit hyperactivity disorder, unspecified type: Secondary | ICD-10-CM

## 2020-12-15 NOTE — Telephone Encounter (Signed)
Pended.

## 2020-12-15 NOTE — Telephone Encounter (Signed)
Pt needs a refill on her vyvanse 20 mg to be [facility] [place]. She has an appt on 7/1

## 2020-12-16 MED ORDER — LISDEXAMFETAMINE DIMESYLATE 20 MG PO CAPS: 20.0000 mg | ORAL_CAPSULE | Freq: Every day | ORAL | 0 refills | Status: DC

## 2020-12-18 ENCOUNTER — Other Ambulatory Visit: Payer: Self-pay

## 2020-12-18 ENCOUNTER — Ambulatory Visit (INDEPENDENT_AMBULATORY_CARE_PROVIDER_SITE_OTHER): Payer: 59 | Admitting: Adult Health

## 2020-12-18 ENCOUNTER — Encounter: Payer: Self-pay | Admitting: Adult Health

## 2020-12-18 DIAGNOSIS — F411 Generalized anxiety disorder: Secondary | ICD-10-CM | POA: Diagnosis not present

## 2020-12-18 DIAGNOSIS — F909 Attention-deficit hyperactivity disorder, unspecified type: Secondary | ICD-10-CM | POA: Diagnosis not present

## 2020-12-18 DIAGNOSIS — F4310 Post-traumatic stress disorder, unspecified: Secondary | ICD-10-CM | POA: Diagnosis not present

## 2020-12-18 DIAGNOSIS — G4700 Insomnia, unspecified: Secondary | ICD-10-CM | POA: Diagnosis not present

## 2020-12-18 MED ORDER — ALPRAZOLAM 0.25 MG PO TABS: 0.2500 mg | ORAL_TABLET | Freq: Every day | ORAL | 1 refills | Status: DC

## 2020-12-18 NOTE — Progress Notes (Signed)
Kristin Sandoval [id number] 1985/09/30 35 y.o.  Subjective:   Patient ID:  Kristin Sandoval is a 35 y.o. (DOB Mar 25, 1986) female.  Chief Complaint: No chief complaint on file.   HPI Kristin Sandoval presents to the office today for follow-up of PTSD, GAD, insomnia, MDD, and PTSD.  Describes mood today as "ok". Pleasant. Mood symptoms - denies depression, anxiety, or irritability. Stating "I'm doing ok". Feels like medications continue to work well. Reports ongoing issues with sleep. Has gained over 50 pounds since starting on Seroquel. Has tried numerous sleep aids with little success. Has taken Xanax in the past with positive sleep results. Is concerned about trying it again. Stating "I'm concerned, but can't keep gaining the weight". Maintains sobriety - coming up on 2 years . Varying interest and motivation. Taking medications as prescribed.  Energy levels stable. Active, does not have a regular exercise routine.  Enjoys some usual interests and activities. Single. Lives alone. Talking with friends. Getting out some. Riding horse.  Appetite adequate. Weight stable - 180 pounds.  Sleeps well most night. Averages 8 hours with Seroquel. Focus and concentration stable. Completing tasks. Managing aspects of household. 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 Denies SI or HI.  Denies AH or VH.  Previous medication trials: Wellbutrin - rash, Abilify, Risperdal, Wellbutrin, Johnnye Sima, Seoquel    PHQ2-9    Flowsheet Row Office Visit from 03/22/2017 in [facility] [place] Visit from 03/04/2016 in [place] at [facility]  PHQ-2 Total Score 3 0  PHQ-9 Total Score 10 --        Review of Systems:  Review of Systems  Musculoskeletal:  Negative for gait problem.  Neurological:  Negative for tremors.  Psychiatric/Behavioral:         Please refer to HPI   Medications: I have reviewed the patient's  current medications.  Current Outpatient Medications  Medication Sig Dispense Refill   ALPRAZolam (XANAX) 0.25 MG tablet Take 1 tablet (0.25 mg total) by mouth at bedtime. 15 tablet 1   albuterol (PROAIR HFA) 108 (90 Base) MCG/ACT inhaler INHALE 2 PUFFS INTO THE LUNGS EVERY 6 (SIX) HOURS AS NEEDED FOR WHEEZING OR SHORTNESS OF BREATH. 8.5 Inhaler 0   busPIRone (BUSPAR) 10 MG tablet Take 1 tablet (10 mg total) by mouth 3 (three) times daily. 90 tablet 2   Cholecalciferol (VITAMIN D3) 1.25 MG (50000 UT) CAPS SMARTSIG:1 Capsule(s) By Mouth Once a Week PRN     desvenlafaxine (PRISTIQ) 50 MG 24 hr tablet TAKE 1 TABLET BY MOUTH EVERY DAY 90 tablet 2   Desvenlafaxine Succinate ER (PRISTIQ) 25 MG TB24 Take 25 mg by mouth in the morning. 7 tablet 0   doxepin (SINEQUAN) 10 MG capsule Take 1 capsule (10 mg total) by mouth at bedtime. 30 capsule 2   ibuprofen (ADVIL,MOTRIN) 600 MG tablet 1 tab po q8h prn at onset of headache 30 tablet 3   LATUDA 20 MG TABS tablet TAKE 1 TABLET BY MOUTH EVERY DAY 30 tablet 2   lisdexamfetamine (VYVANSE) 20 MG capsule Take 1 capsule (20 mg total) by mouth daily. 90 capsule 0   metroNIDAZOLE (METROCREAM) 0.75 % cream APPLY TO AFFECTED AREA EVERY DAY AS NEEDED     temazepam (RESTORIL) 15 MG capsule Take 1 capsule (15 mg total) by mouth at bedtime as needed for sleep. 30 capsule 2   tretinoin (RETIN-A) 0.025 % cream APPLY TO AFFECTED AREA EVERY  DAY AT BEDTIME     tretinoin (RETIN-A) 0.05 % cream Apply topically at bedtime. 45 g 0   valACYclovir (VALTREX) 1000 MG tablet      No current facility-administered medications for this visit.    Medication Side Effects: None  Allergies:  Allergies  Allergen Reactions   Apple Shortness Of Breath   Acrylic Polymer [Carbomer]     Loses nail   Latex     Latex "mild skin irritation"   Other Other (See Comments)    STRAW / HAY.  SEVERE  ITCHING, CONGESTION.   Adhesive [Tape] Rash   Denture Adhesive Rash   Mango Flavor Rash     Past Medical History:  Diagnosis Date   ADHD (attention deficit hyperactivity disorder)    Adderall used   Asthma    childhood   Complication of anesthesia    some difficulty awakening   Headache    migraines in past- none recent   Skin cancer    basal cell carcinoma    Past Medical History, Surgical history, Social history, and Family history were reviewed and updated as appropriate.   Please see review of systems for further details on the patient's review from today.   Objective:   Physical Exam:  There were no vitals taken for this visit.  Physical Exam Constitutional:      General: She is not in acute distress. Musculoskeletal:        General: No deformity.  Neurological:     Mental Status: She is alert and oriented to person, place, and time.     Coordination: Coordination normal.  Psychiatric:        Attention and Perception: Attention and perception normal. She does not perceive auditory or visual hallucinations.        Mood and Affect: Mood normal. Mood is not anxious or depressed. Affect is not labile, blunt, angry or inappropriate.        Speech: Speech normal.        Behavior: Behavior normal.        Thought Content: Thought content normal. Thought content is not paranoid or delusional. Thought content does not include homicidal or suicidal ideation. Thought content does not include homicidal or suicidal plan.        Cognition and Memory: Cognition and memory normal.        Judgment: Judgment normal.     Comments: Insight intact    Lab Review:     Component Value Date/Time   NA 135 06/09/2017 1135   K 4.0 06/09/2017 1135   CL 101 06/09/2017 1135   CO2 28 06/09/2017 1135   GLUCOSE 83 06/09/2017 1135   BUN 12 06/09/2017 1135   CREATININE 0.88 06/09/2017 1135   CREATININE 0.80 05/10/2013 1451   CALCIUM 8.8 06/09/2017 1135   PROT 7.9 03/22/2017 1253   ALBUMIN 4.6 03/22/2017 1253   AST 10 03/22/2017 1253   ALT 10 03/22/2017 1253   ALKPHOS 55  03/22/2017 1253   BILITOT 0.7 03/22/2017 1253   GFRNONAA >89 05/10/2013 1451   GFRAA >89 05/10/2013 1451       Component Value Date/Time   WBC 7.0 03/22/2017 1253   RBC 4.58 03/22/2017 1253   HGB 13.8 03/22/2017 1253   HCT 41.5 03/22/2017 1253   PLT 276.0 03/22/2017 1253   MCV 90.7 03/22/2017 1253   MCH 29.5 11/29/2016 1622   MCHC 33.2 03/22/2017 1253   RDW 13.5 03/22/2017 1253   LYMPHSABS 2.3 03/22/2017 1253  MONOABS 0.6 03/22/2017 1253   EOSABS 0.1 03/22/2017 1253   BASOSABS 0.0 03/22/2017 1253    No results found for: POCLITH, LITHIUM   No results found for: PHENYTOIN, PHENOBARB, VALPROATE, CBMZ   .res Assessment: Plan:    Plan:  PDMP reviewed  Latuda 20mg  daily  Vyvanse 20mg  daily  Pristiq 50mg  daily  Buspar 10mg  TID prn  Add Xanax 0.5mg  - 15 day supply  RTC 3 months  Patient advised to contact office with any questions, adverse effects, or acute worsening in signs and symptoms.  Discussed potential metabolic side effects associated with atypical antipsychotics, as well as potential risk for movement side effects. Advised pt to contact office if movement side effects occur.   Discussed potential benefits, risks, and side effects of stimulants with patient to include increased heart rate, palpitations, insomnia, increased anxiety, increased irritability, or decreased appetite.  Instructed patient to contact office if experiencing any significant tolerability issues.    Diagnoses and all orders for this visit:  Generalized anxiety disorder -     ALPRAZolam (XANAX) 0.25 MG tablet; Take 1 tablet (0.25 mg total) by mouth at bedtime.  Insomnia, unspecified type  Attention deficit hyperactivity disorder (ADHD), unspecified ADHD type  PTSD (post-traumatic stress disorder)    Please see After Visit Summary for patient specific instructions.  No future appointments.  No orders of the defined types were placed in this  encounter.   -------------------------------

## 2020-12-25 ENCOUNTER — Encounter: Payer: Self-pay | Admitting: Adult Health

## 2020-12-25 ENCOUNTER — Telehealth (INDEPENDENT_AMBULATORY_CARE_PROVIDER_SITE_OTHER): Payer: 59 | Admitting: Adult Health

## 2020-12-25 DIAGNOSIS — F4310 Post-traumatic stress disorder, unspecified: Secondary | ICD-10-CM | POA: Diagnosis not present

## 2020-12-25 DIAGNOSIS — G4700 Insomnia, unspecified: Secondary | ICD-10-CM | POA: Diagnosis not present

## 2020-12-25 DIAGNOSIS — F909 Attention-deficit hyperactivity disorder, unspecified type: Secondary | ICD-10-CM

## 2020-12-25 DIAGNOSIS — F411 Generalized anxiety disorder: Secondary | ICD-10-CM

## 2020-12-25 MED ORDER — HYDROXYZINE PAMOATE 25 MG PO CAPS
50.0000 mg | ORAL_CAPSULE | Freq: Three times a day (TID) | ORAL | 5 refills | Status: DC | PRN
Start: 2020-12-25 — End: 2022-09-01

## 2020-12-25 MED ORDER — ALPRAZOLAM 0.5 MG PO TABS: 0.2500 mg | ORAL_TABLET | Freq: Every day | ORAL | 2 refills | Status: DC

## 2020-12-25 MED ORDER — BUSPIRONE HCL 10 MG PO TABS: 10.0000 mg | ORAL_TABLET | Freq: Three times a day (TID) | ORAL | 2 refills | Status: DC

## 2020-12-25 NOTE — Progress Notes (Signed)
Kristin Sandoval [id number] 01/21/1986 35 y.o.  Virtual Visit via Video Note  I connected with pt @ on 12/25/20 at  8:20 AM EDT by a video enabled telemedicine application and verified that I am speaking with the correct person using two identifiers.   I discussed the limitations of evaluation and management by telemedicine and the availability of in person appointments. The patient expressed understanding and agreed to proceed.  I discussed the assessment and treatment plan with the patient. The patient was provided an opportunity to ask questions and all were answered. The patient agreed with the plan and demonstrated an understanding of the instructions.   The patient was advised to call back or seek an in-person evaluation if the symptoms worsen or if the condition fails to improve as anticipated.  I provided 30 minutes of non-face-to-face time during this encounter.  The patient was located at home.  The provider was located at [place].   Aloha Gell, NP   Subjective:   Patient ID:  Kristin Sandoval is a 35 y.o. (DOB 1985/08/04) female.  Chief Complaint: No chief complaint on file.   HPI SAGA BALTHAZAR presents for follow-up of PTSD, GAD, insomnia, MDD, and PTSD.   Feels like addition of Xanax has been helpful.  Describes mood today as "ok". Pleasant. Mood symptoms - denies depression, anxiety, or irritability. Stating "I'm doing ok". Has started taking Xanax for sleep and feels it has helped. Denies any "euphoria" and is pleased with effects. Feels like other medications continue to work well. Maintains sobriety - 2 years. Traveling to [facility] for work. Improved interest and motivation. Taking medications as prescribed.  Energy levels stable. Active, does not have a regular exercise routine.  Enjoys some usual interests and activities. Single. Lives alone. Talking with friends. Getting out some. Riding horse.  Appetite adequate. Weight stable - 180 pounds.   Sleeps well most night. Averages 8 hours - taking Xanax 0.25mg . Focus and concentration stable. Completing tasks. Managing aspects of household. 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 Denies SI or HI.  Denies AH or VH.  Previous medication trials: Wellbutrin - rash, Abilify, Risperdal, Wellbutrin, Lunesta, Seoquel    Review of Systems:  Review of Systems  Musculoskeletal:  Negative for gait problem.  Neurological:  Negative for tremors.  Psychiatric/Behavioral:         Please refer to HPI   Medications: I have reviewed the patient's current medications.  Current Outpatient Medications  Medication Sig Dispense Refill   hydrOXYzine (VISTARIL) 25 MG capsule Take 2 capsules (50 mg total) by mouth 3 (three) times daily as needed. 90 capsule 5   albuterol (PROAIR HFA) 108 (90 Base) MCG/ACT inhaler INHALE 2 PUFFS INTO THE LUNGS EVERY 6 (SIX) HOURS AS NEEDED FOR WHEEZING OR SHORTNESS OF BREATH. 8.5 Inhaler 0   ALPRAZolam (XANAX) 0.5 MG tablet Take 0.5 tablets (0.25 mg total) by mouth at bedtime. 30 tablet 2   busPIRone (BUSPAR) 10 MG tablet Take 1 tablet (10 mg total) by mouth 3 (three) times daily. 90 tablet 2   Cholecalciferol (VITAMIN D3) 1.25 MG (50000 UT) CAPS SMARTSIG:1 Capsule(s) By Mouth Once a Week PRN     desvenlafaxine (PRISTIQ) 50 MG 24 hr tablet TAKE 1 TABLET BY MOUTH EVERY DAY 90 tablet 2   Desvenlafaxine Succinate ER (PRISTIQ) 25 MG TB24 Take 25 mg by mouth in the morning. 7 tablet 0   doxepin (SINEQUAN) 10 MG capsule Take 1 capsule (10  mg total) by mouth at bedtime. 30 capsule 2   ibuprofen (ADVIL,MOTRIN) 600 MG tablet 1 tab po q8h prn at onset of headache 30 tablet 3   LATUDA 20 MG TABS tablet TAKE 1 TABLET BY MOUTH EVERY DAY 30 tablet 2   lisdexamfetamine (VYVANSE) 20 MG capsule Take 1 capsule (20 mg total) by mouth daily. 90 capsule 0   metroNIDAZOLE (METROCREAM) 0.75 % cream APPLY TO AFFECTED AREA EVERY DAY AS NEEDED     temazepam (RESTORIL) 15  MG capsule Take 1 capsule (15 mg total) by mouth at bedtime as needed for sleep. 30 capsule 2   tretinoin (RETIN-A) 0.025 % cream APPLY TO AFFECTED AREA EVERY DAY AT BEDTIME     tretinoin (RETIN-A) 0.05 % cream Apply topically at bedtime. 45 g 0   valACYclovir (VALTREX) 1000 MG tablet      No current facility-administered medications for this visit.    Medication Side Effects: None  Allergies:  Allergies  Allergen Reactions   Apple Shortness Of Breath   Acrylic Polymer [Carbomer]     Loses nail   Latex     Latex "mild skin irritation"   Other Other (See Comments)    STRAW / HAY.  SEVERE  ITCHING, CONGESTION.   Adhesive [Tape] Rash   Denture Adhesive Rash   Mango Flavor Rash    Past Medical History:  Diagnosis Date   ADHD (attention deficit hyperactivity disorder)    Adderall used   Asthma    childhood   Complication of anesthesia    some difficulty awakening   Headache    migraines in past- none recent   Skin cancer    basal cell carcinoma    Family History  Problem Relation Age of Onset   Alcohol abuse Father    Arthritis Maternal Uncle    Hyperlipidemia Maternal Uncle    Alcohol abuse Paternal Aunt    Arthritis Maternal Grandmother        DDD, spinal stenosis   Hyperlipidemia Maternal Grandmother    Alcohol abuse Maternal Grandfather    Alcohol abuse Paternal Grandfather     Social History   Socioeconomic History   Marital status: Single    Spouse name: Not on file   Number of children: 0   Years of education: Not on file   Highest education level: Not on file  Occupational History    Employer: [employer]  Tobacco Use   Smoking status: Former    Pack years: 0.00    Types: Cigarettes    Quit date: 06/30/2006    Years since quitting: 14.4   Smokeless tobacco: Never  Substance and Sexual Activity   Alcohol use: Yes    Comment: 1-3 drinks weekly   Drug use: No   Sexual activity: Yes    Birth control/protection: Pill  Other Topics Concern   Not on  file  Social History Narrative   Caffeine Use:  1 cup coffee daily   Regular exercise:  4 x weekly         Social Determinants of Health   Financial Resource Strain: Not on file  Food Insecurity: Not on file  Transportation Needs: Not on file  Physical Activity: Not on file  Stress: Not on file  Social Connections: Not on file  Intimate Partner Violence: Not on file    Past Medical History, Surgical history, Social history, and Family history were reviewed and updated as appropriate.   Please see review of systems for further details on  the patient's review from today.   Objective:   Physical Exam:  There were no vitals taken for this visit.  Physical Exam Constitutional:      General: She is not in acute distress. Musculoskeletal:        General: No deformity.  Neurological:     Mental Status: She is alert and oriented to person, place, and time.     Coordination: Coordination normal.  Psychiatric:        Attention and Perception: Attention and perception normal. She does not perceive auditory or visual hallucinations.        Mood and Affect: Mood normal. Mood is not anxious or depressed. Affect is not labile, blunt, angry or inappropriate.        Speech: Speech normal.        Behavior: Behavior normal.        Thought Content: Thought content normal. Thought content is not paranoid or delusional. Thought content does not include homicidal or suicidal ideation. Thought content does not include homicidal or suicidal plan.        Cognition and Memory: Cognition and memory normal.        Judgment: Judgment normal.     Comments: Insight intact    Lab Review:     Component Value Date/Time   NA 135 06/09/2017 1135   K 4.0 06/09/2017 1135   CL 101 06/09/2017 1135   CO2 28 06/09/2017 1135   GLUCOSE 83 06/09/2017 1135   BUN 12 06/09/2017 1135   CREATININE 0.88 06/09/2017 1135   CREATININE 0.80 05/10/2013 1451   CALCIUM 8.8 06/09/2017 1135   PROT 7.9 03/22/2017 1253    ALBUMIN 4.6 03/22/2017 1253   AST 10 03/22/2017 1253   ALT 10 03/22/2017 1253   ALKPHOS 55 03/22/2017 1253   BILITOT 0.7 03/22/2017 1253   GFRNONAA >89 05/10/2013 1451   GFRAA >89 05/10/2013 1451       Component Value Date/Time   WBC 7.0 03/22/2017 1253   RBC 4.58 03/22/2017 1253   HGB 13.8 03/22/2017 1253   HCT 41.5 03/22/2017 1253   PLT 276.0 03/22/2017 1253   MCV 90.7 03/22/2017 1253   MCH 29.5 11/29/2016 1622   MCHC 33.2 03/22/2017 1253   RDW 13.5 03/22/2017 1253   LYMPHSABS 2.3 03/22/2017 1253   MONOABS 0.6 03/22/2017 1253   EOSABS 0.1 03/22/2017 1253   BASOSABS 0.0 03/22/2017 1253    No results found for: POCLITH, LITHIUM   No results found for: PHENYTOIN, PHENOBARB, VALPROATE, CBMZ   .res Assessment: Plan:     Plan:  PDMP reviewed  Hydroxyzine 25mg  TID prn anxiety Increase Xanax 0.25mg  to 0.5mg  at hs  Latuda 20mg  daily  Vyvanse 20mg  daily  Pristiq 50mg  daily  Buspar 10mg  TID prn   RTC 3 months  Patient advised to contact office with any questions, adverse effects, or acute worsening in signs and symptoms.  Discussed potential metabolic side effects associated with atypical antipsychotics, as well as potential risk for movement side effects. Advised pt to contact office if movement side effects occur.   Discussed potential benefits, risks, and side effects of stimulants with patient to include increased heart rate, palpitations, insomnia, increased anxiety, increased irritability, or decreased appetite.  Instructed patient to contact office if experiencing any significant tolerability issues.    Diagnoses and all orders for this visit:  Insomnia, unspecified type -     hydrOXYzine (VISTARIL) 25 MG capsule; Take 2 capsules (50 mg total) by mouth 3 (three)  times daily as needed.  Attention deficit hyperactivity disorder (ADHD), unspecified ADHD type  PTSD (post-traumatic stress disorder)  Generalized anxiety disorder -     ALPRAZolam (XANAX) 0.5  MG tablet; Take 0.5 tablets (0.25 mg total) by mouth at bedtime. -     hydrOXYzine (VISTARIL) 25 MG capsule; Take 2 capsules (50 mg total) by mouth 3 (three) times daily as needed. -     busPIRone (BUSPAR) 10 MG tablet; Take 1 tablet (10 mg total) by mouth 3 (three) times daily.    Please see After Visit Summary for patient specific instructions.  No future appointments.   No orders of the defined types were placed in this encounter.     -------------------------------

## 2020-12-28 ENCOUNTER — Other Ambulatory Visit: Payer: Self-pay

## 2020-12-28 DIAGNOSIS — F411 Generalized anxiety disorder: Secondary | ICD-10-CM

## 2020-12-28 MED ORDER — ALPRAZOLAM 0.5 MG PO TABS: 0.5000 mg | ORAL_TABLET | Freq: Every day | ORAL | 2 refills | Status: DC

## 2021-02-07 ENCOUNTER — Other Ambulatory Visit: Payer: Self-pay | Admitting: Adult Health

## 2021-02-07 DIAGNOSIS — F411 Generalized anxiety disorder: Secondary | ICD-10-CM

## 2021-02-07 DIAGNOSIS — F331 Major depressive disorder, recurrent, moderate: Secondary | ICD-10-CM

## 2021-05-14 ENCOUNTER — Other Ambulatory Visit: Payer: Self-pay | Admitting: Adult Health

## 2021-05-14 DIAGNOSIS — F411 Generalized anxiety disorder: Secondary | ICD-10-CM

## 2021-05-14 DIAGNOSIS — F331 Major depressive disorder, recurrent, moderate: Secondary | ICD-10-CM

## 2021-08-13 ENCOUNTER — Other Ambulatory Visit: Payer: Self-pay | Admitting: Adult Health

## 2021-08-13 DIAGNOSIS — F411 Generalized anxiety disorder: Secondary | ICD-10-CM

## 2021-08-13 DIAGNOSIS — F331 Major depressive disorder, recurrent, moderate: Secondary | ICD-10-CM

## 2021-08-13 NOTE — Telephone Encounter (Signed)
Please call to schedule appt. Last visit 12/25/20 with F/U in 3 months. Last RF sent with note patient needed appt.

## 2021-11-11 ENCOUNTER — Other Ambulatory Visit: Payer: Self-pay | Admitting: Adult Health

## 2021-11-11 DIAGNOSIS — F331 Major depressive disorder, recurrent, moderate: Secondary | ICD-10-CM

## 2021-11-11 DIAGNOSIS — F411 Generalized anxiety disorder: Secondary | ICD-10-CM

## 2021-11-11 NOTE — Telephone Encounter (Signed)
Mailbox is full.

## 2021-11-11 NOTE — Telephone Encounter (Signed)
Please call to schedule appt, last seen 7/22 with RTC in 3 mo.

## 2021-11-12 NOTE — Telephone Encounter (Signed)
Tried to contact patient and mailbox is full.

## 2022-02-12 ENCOUNTER — Other Ambulatory Visit: Payer: Self-pay | Admitting: Adult Health

## 2022-02-12 DIAGNOSIS — F331 Major depressive disorder, recurrent, moderate: Secondary | ICD-10-CM

## 2022-02-12 DIAGNOSIS — F411 Generalized anxiety disorder: Secondary | ICD-10-CM

## 2022-02-22 ENCOUNTER — Other Ambulatory Visit: Payer: Self-pay

## 2022-02-22 ENCOUNTER — Telehealth: Payer: Self-pay | Admitting: Adult Health

## 2022-02-22 DIAGNOSIS — F331 Major depressive disorder, recurrent, moderate: Secondary | ICD-10-CM

## 2022-02-22 DIAGNOSIS — F411 Generalized anxiety disorder: Secondary | ICD-10-CM

## 2022-02-22 MED ORDER — DESVENLAFAXINE SUCCINATE ER 50 MG PO TB24: 50.0000 mg | ORAL_TABLET | Freq: Every day | ORAL | 0 refills | Status: DC

## 2022-02-22 NOTE — Telephone Encounter (Signed)
Rx sent 

## 2022-02-22 NOTE — Telephone Encounter (Signed)
Pt would like refill of Pristiq sent to  [id number]- [place], [place] - [address] [address] Phone:  [phone] Fax:  [phone]  Next appt 9/14

## 2022-03-03 ENCOUNTER — Encounter: Payer: Self-pay | Admitting: Adult Health

## 2022-03-03 ENCOUNTER — Ambulatory Visit (INDEPENDENT_AMBULATORY_CARE_PROVIDER_SITE_OTHER): Payer: 59 | Admitting: Adult Health

## 2022-03-03 DIAGNOSIS — F4310 Post-traumatic stress disorder, unspecified: Secondary | ICD-10-CM | POA: Diagnosis not present

## 2022-03-03 DIAGNOSIS — F909 Attention-deficit hyperactivity disorder, unspecified type: Secondary | ICD-10-CM

## 2022-03-03 DIAGNOSIS — F411 Generalized anxiety disorder: Secondary | ICD-10-CM | POA: Diagnosis not present

## 2022-03-03 DIAGNOSIS — G4700 Insomnia, unspecified: Secondary | ICD-10-CM

## 2022-03-03 DIAGNOSIS — F331 Major depressive disorder, recurrent, moderate: Secondary | ICD-10-CM

## 2022-03-03 MED ORDER — LISDEXAMFETAMINE DIMESYLATE 10 MG PO CAPS: 10.0000 mg | ORAL_CAPSULE | Freq: Every day | ORAL | 0 refills | Status: DC

## 2022-03-03 MED ORDER — DESVENLAFAXINE SUCCINATE ER 25 MG PO TB24: 25.0000 mg | ORAL_TABLET | Freq: Two times a day (BID) | ORAL | 3 refills | Status: DC

## 2022-03-03 NOTE — Progress Notes (Signed)
Kristin Sandoval [id number] 1985/11/25 36 y.o.  Subjective:   Patient ID:  Kristin Sandoval is a 36 y.o. (DOB 1985/07/30) female.  Chief Complaint: No chief complaint on file.   HPI [name] presents to the office today for follow-up of  PTSD, GAD, insomnia, MDD, and PTSD.  Describes mood today as "ok". Pleasant. Mood symptoms - denies depression, anxiety or irritability. Mood is consistent. Stating "life is good". Would like to start tapering off the Pristiq '50mg'$  daily. Would like to continue the Vyvanse as needed in the work setting. Maintains sobriety - 3 years. Stable interest and motivation. Taking medications as prescribed.  Energy levels stable. Active, does not have a regular exercise routine.  Enjoys some usual interests and activities. Single. Lives alone. Talking with friends. Getting out some. Riding horse.  Appetite adequate. Weight stable - 180 pounds.  Sleeps well most night. Averages 6 to 7 hours. Focus and concentration stable. Completing tasks. Managing aspects of household. 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 Denies SI or HI.  Denies AH or VH.  Previous medication trials: Wellbutrin - rash, Abilify, Risperdal, Wellbutrin, Johnnye Sima, Seoquel    PHQ2-9    Flowsheet Row Office Visit from 03/22/2017 in [facility] [place] Visit from 03/04/2016 in [place] at [facility]  PHQ-2 Total Score 3 0  PHQ-9 Total Score 10 --        Review of Systems:  Review of Systems  Musculoskeletal:  Negative for gait problem.  Neurological:  Negative for tremors.  Psychiatric/Behavioral:         Please refer to HPI    Medications: I have reviewed the patient's current medications.  Current Outpatient Medications  Medication Sig Dispense Refill   albuterol (PROAIR HFA) 108 (90 Base) MCG/ACT inhaler INHALE 2 PUFFS INTO THE LUNGS EVERY 6 (SIX) HOURS AS NEEDED FOR  WHEEZING OR SHORTNESS OF BREATH. 8.5 Inhaler 0   ALPRAZolam (XANAX) 0.5 MG tablet Take 1 tablet (0.5 mg total) by mouth at bedtime. 30 tablet 2   busPIRone (BUSPAR) 10 MG tablet Take 1 tablet (10 mg total) by mouth 3 (three) times daily. 90 tablet 2   Cholecalciferol (VITAMIN D3) 1.25 MG (50000 UT) CAPS SMARTSIG:1 Capsule(s) By Mouth Once a Week PRN     desvenlafaxine (PRISTIQ) 25 MG 24 hr tablet Take 1 tablet (25 mg total) by mouth 2 (two) times daily. 180 tablet 3   Desvenlafaxine Succinate ER (PRISTIQ) 25 MG TB24 Take 25 mg by mouth in the morning. 7 tablet 0   doxepin (SINEQUAN) 10 MG capsule Take 1 capsule (10 mg total) by mouth at bedtime. 30 capsule 2   hydrOXYzine (VISTARIL) 25 MG capsule Take 2 capsules (50 mg total) by mouth 3 (three) times daily as needed. 90 capsule 5   ibuprofen (ADVIL,MOTRIN) 600 MG tablet 1 tab po q8h prn at onset of headache 30 tablet 3   LATUDA 20 MG TABS tablet TAKE 1 TABLET BY MOUTH EVERY DAY 30 tablet 2   lisdexamfetamine (VYVANSE) 10 MG capsule Take 1 capsule (10 mg total) by mouth daily. 90 capsule 0   metroNIDAZOLE (METROCREAM) 0.75 % cream APPLY TO AFFECTED AREA EVERY DAY AS NEEDED     temazepam (RESTORIL) 15 MG capsule Take 1 capsule (15 mg total) by mouth at bedtime as needed for sleep. 30 capsule 2   tretinoin (RETIN-A) 0.025 % cream APPLY TO AFFECTED AREA EVERY  DAY AT BEDTIME     tretinoin (RETIN-A) 0.05 % cream Apply topically at bedtime. 45 g 0   valACYclovir (VALTREX) 1000 MG tablet      No current facility-administered medications for this visit.    Medication Side Effects: None  Allergies:  Allergies  Allergen Reactions   Apple Juice Shortness Of Breath   Acrylic Polymer [Carbomer]     Loses nail   Latex     Latex "mild skin irritation"   Other Other (See Comments)    STRAW / HAY.  SEVERE  ITCHING, CONGESTION.   Adhesive [Tape] Rash   Denture Adhesive Rash   Mango Flavor Rash    Past Medical History:  Diagnosis Date   ADHD  (attention deficit hyperactivity disorder)    Adderall used   Asthma    childhood   Complication of anesthesia    some difficulty awakening   Headache    migraines in past- none recent   Skin cancer    basal cell carcinoma    Past Medical History, Surgical history, Social history, and Family history were reviewed and updated as appropriate.   Please see review of systems for further details on the patient's review from today.   Objective:   Physical Exam:  There were no vitals taken for this visit.  Physical Exam Constitutional:      General: She is not in acute distress. Musculoskeletal:        General: No deformity.  Neurological:     Mental Status: She is alert and oriented to person, place, and time.     Coordination: Coordination normal.  Psychiatric:        Attention and Perception: Attention and perception normal. She does not perceive auditory or visual hallucinations.        Mood and Affect: Mood normal. Mood is not anxious or depressed. Affect is not labile, blunt, angry or inappropriate.        Speech: Speech normal.        Behavior: Behavior normal.        Thought Content: Thought content normal. Thought content is not paranoid or delusional. Thought content does not include homicidal or suicidal ideation. Thought content does not include homicidal or suicidal plan.        Cognition and Memory: Cognition and memory normal.        Judgment: Judgment normal.     Comments: Insight intact     Lab Review:     Component Value Date/Time   NA 135 06/09/2017 1135   K 4.0 06/09/2017 1135   CL 101 06/09/2017 1135   CO2 28 06/09/2017 1135   GLUCOSE 83 06/09/2017 1135   BUN 12 06/09/2017 1135   CREATININE 0.88 06/09/2017 1135   CREATININE 0.80 05/10/2013 1451   CALCIUM 8.8 06/09/2017 1135   PROT 7.9 03/22/2017 1253   ALBUMIN 4.6 03/22/2017 1253   AST 10 03/22/2017 1253   ALT 10 03/22/2017 1253   ALKPHOS 55 03/22/2017 1253   BILITOT 0.7 03/22/2017 1253    GFRNONAA >89 05/10/2013 1451   GFRAA >89 05/10/2013 1451       Component Value Date/Time   WBC 7.0 03/22/2017 1253   RBC 4.58 03/22/2017 1253   HGB 13.8 03/22/2017 1253   HCT 41.5 03/22/2017 1253   PLT 276.0 03/22/2017 1253   MCV 90.7 03/22/2017 1253   MCH 29.5 11/29/2016 1622   MCHC 33.2 03/22/2017 1253   RDW 13.5 03/22/2017 1253   LYMPHSABS 2.3 03/22/2017 1253  MONOABS 0.6 03/22/2017 1253   EOSABS 0.1 03/22/2017 1253   BASOSABS 0.0 03/22/2017 1253    No results found for: "POCLITH", "LITHIUM"   No results found for: "PHENYTOIN", "PHENOBARB", "VALPROATE", "CBMZ"   .res Assessment: Plan:    Plan:  PDMP reviewed  Vyvanse '10mg'$  daily  Pristiq '50mg'$  daily - sent in '25mg'$  BID - discussed tapering off medication  RTC 6 months  Patient advised to contact office with any questions, adverse effects, or acute worsening in signs and symptoms.  Discussed potential metabolic side effects associated with atypical antipsychotics, as well as potential risk for movement side effects. Advised pt to contact office if movement side effects occur.   Discussed potential benefits, risks, and side effects of stimulants with patient to include increased heart rate, palpitations, insomnia, increased anxiety, increased irritability, or decreased appetite.  Instructed patient to contact office if experiencing any significant tolerability issues.  Diagnoses and all orders for this visit:  PTSD (post-traumatic stress disorder)  Generalized anxiety disorder -     desvenlafaxine (PRISTIQ) 25 MG 24 hr tablet; Take 1 tablet (25 mg total) by mouth 2 (two) times daily.  Major depressive disorder, recurrent episode, moderate (HCC) -     desvenlafaxine (PRISTIQ) 25 MG 24 hr tablet; Take 1 tablet (25 mg total) by mouth 2 (two) times daily.  Attention deficit hyperactivity disorder (ADHD), unspecified ADHD type -     lisdexamfetamine (VYVANSE) 10 MG capsule; Take 1 capsule (10 mg total) by mouth  daily.  Insomnia, unspecified type     Please see After Visit Summary for patient specific instructions.  Future Appointments  Date Time Provider University  09/01/2022  1:00 PM Temple Sporer, Berdie Ogren, NP [facility] None     No orders of the defined types were placed in this encounter.   -------------------------------

## 2022-08-29 ENCOUNTER — Other Ambulatory Visit: Payer: Self-pay | Admitting: Adult Health

## 2022-08-29 ENCOUNTER — Telehealth: Payer: Self-pay | Admitting: Adult Health

## 2022-08-29 DIAGNOSIS — F411 Generalized anxiety disorder: Secondary | ICD-10-CM

## 2022-08-29 DIAGNOSIS — F331 Major depressive disorder, recurrent, moderate: Secondary | ICD-10-CM

## 2022-08-29 NOTE — Telephone Encounter (Signed)
Error

## 2022-09-01 ENCOUNTER — Ambulatory Visit (INDEPENDENT_AMBULATORY_CARE_PROVIDER_SITE_OTHER): Payer: 59 | Admitting: Adult Health

## 2022-09-01 ENCOUNTER — Encounter: Payer: Self-pay | Admitting: Adult Health

## 2022-09-01 DIAGNOSIS — F4310 Post-traumatic stress disorder, unspecified: Secondary | ICD-10-CM

## 2022-09-01 DIAGNOSIS — F411 Generalized anxiety disorder: Secondary | ICD-10-CM | POA: Diagnosis not present

## 2022-09-01 DIAGNOSIS — F909 Attention-deficit hyperactivity disorder, unspecified type: Secondary | ICD-10-CM | POA: Diagnosis not present

## 2022-09-01 DIAGNOSIS — F331 Major depressive disorder, recurrent, moderate: Secondary | ICD-10-CM

## 2022-09-01 DIAGNOSIS — G4700 Insomnia, unspecified: Secondary | ICD-10-CM

## 2022-09-01 MED ORDER — LISDEXAMFETAMINE DIMESYLATE 20 MG PO CAPS: 20.0000 mg | ORAL_CAPSULE | Freq: Every day | ORAL | 0 refills | Status: DC

## 2022-09-01 MED ORDER — DESVENLAFAXINE SUCCINATE ER 25 MG PO TB24: 25.0000 mg | ORAL_TABLET | Freq: Every day | ORAL | 1 refills | Status: DC

## 2022-09-01 MED ORDER — HYDROXYZINE PAMOATE 25 MG PO CAPS: 50.0000 mg | ORAL_CAPSULE | Freq: Three times a day (TID) | ORAL | 5 refills | Status: DC | PRN

## 2022-09-01 NOTE — Progress Notes (Signed)
Kristin Sandoval [id number] 09-06-1985 37 y.o.  Subjective:   Patient ID:  Kristin Sandoval is a 37 y.o. (DOB July 25, 1985) female.  Chief Complaint: No chief complaint on file.   HPI Kristin Sandoval presents to the office today for follow-up of GAD, insomnia, MDD, and PTSD.  Describes mood today as "ok". Pleasant. Mood symptoms - denies depression, anxiety or irritability. Mood is consistent. Stating "I'm doing good". Has tapered down on the Pristiq to '25mg'$  and is ready to discontinue. Would like to increase Vyvanse from '10mg'$  to '20mg'$  daily. Maintains sobriety - 3 years. Stable interest and motivation. Taking medications as prescribed.  Energy levels lower. Active, does not have a regular exercise routine.  Enjoys some usual interests and activities. Single. Dating. Lives alone. Talking with friends.  Appetite adequate. Weight stable - 180 pounds.  Sleeps well most night. Averages 6 to 7 hours. Reports some daytime napping. Focus and concentration stable. Completing tasks. Managing aspects of household. Works full time Pharmacist, community. 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 Denies SI or HI.  Denies AH or VH. Denies self harm. Denies substance use.  Previous medication trials: Wellbutrin - rash, Abilify, Risperdal, Wellbutrin, Johnnye Sima, Seoquel   PHQ2-9    Flowsheet Row Office Visit from 03/22/2017 in [facility] [place] Visit from 03/04/2016 in [facility]  PHQ-2 Total Score 3 0  PHQ-9 Total Score 10 --        Review of Systems:  Review of Systems  Respiratory:  Negative for shortness of breath.   Musculoskeletal:  Negative for gait problem.  Neurological:  Negative for tremors.  Psychiatric/Behavioral:         Please refer to HPI    Medications: I have reviewed the patient's current medications.  Current Outpatient Medications  Medication Sig Dispense Refill   albuterol (PROAIR  HFA) 108 (90 Base) MCG/ACT inhaler INHALE 2 PUFFS INTO THE LUNGS EVERY 6 (SIX) HOURS AS NEEDED FOR WHEEZING OR SHORTNESS OF BREATH. 8.5 Inhaler 0   ALPRAZolam (XANAX) 0.5 MG tablet Take 1 tablet (0.5 mg total) by mouth at bedtime. 30 tablet 2   busPIRone (BUSPAR) 10 MG tablet Take 1 tablet (10 mg total) by mouth 3 (three) times daily. 90 tablet 2   Cholecalciferol (VITAMIN D3) 1.25 MG (50000 UT) CAPS SMARTSIG:1 Capsule(s) By Mouth Once a Week PRN     desvenlafaxine (PRISTIQ) 25 MG 24 hr tablet Take 1 tablet (25 mg total) by mouth 2 (two) times daily. 180 tablet 3   Desvenlafaxine Succinate ER (PRISTIQ) 25 MG TB24 Take 25 mg by mouth in the morning. 7 tablet 0   doxepin (SINEQUAN) 10 MG capsule Take 1 capsule (10 mg total) by mouth at bedtime. 30 capsule 2   hydrOXYzine (VISTARIL) 25 MG capsule Take 2 capsules (50 mg total) by mouth 3 (three) times daily as needed. 90 capsule 5   ibuprofen (ADVIL,MOTRIN) 600 MG tablet 1 tab po q8h prn at onset of headache 30 tablet 3   LATUDA 20 MG TABS tablet TAKE 1 TABLET BY MOUTH EVERY DAY 30 tablet 2   lisdexamfetamine (VYVANSE) 10 MG capsule Take 1 capsule (10 mg total) by mouth daily. 90 capsule 0   metroNIDAZOLE (METROCREAM) 0.75 % cream APPLY TO AFFECTED AREA EVERY DAY AS NEEDED     temazepam (RESTORIL) 15 MG capsule Take 1 capsule (15 mg total) by mouth at bedtime as needed for sleep.  30 capsule 2   tretinoin (RETIN-A) 0.025 % cream APPLY TO AFFECTED AREA EVERY DAY AT BEDTIME     tretinoin (RETIN-A) 0.05 % cream Apply topically at bedtime. 45 g 0   valACYclovir (VALTREX) 1000 MG tablet      No current facility-administered medications for this visit.    Medication Side Effects: None  Allergies:  Allergies  Allergen Reactions   Apple Juice Shortness Of Breath   Acrylic Polymer [Carbomer]     Loses nail   Latex     Latex "mild skin irritation"   Other Other (See Comments)    STRAW / HAY.  SEVERE  ITCHING, CONGESTION.   Adhesive [Tape] Rash    Denture Adhesive Rash   Mango Flavor Rash    Past Medical History:  Diagnosis Date   ADHD (attention deficit hyperactivity disorder)    Adderall used   Asthma    childhood   Complication of anesthesia    some difficulty awakening   Headache    migraines in past- none recent   Skin cancer    basal cell carcinoma    Past Medical History, Surgical history, Social history, and Family history were reviewed and updated as appropriate.   Please see review of systems for further details on the patient's review from today.   Objective:   Physical Exam:  There were no vitals taken for this visit.  Physical Exam Constitutional:      General: She is not in acute distress. Musculoskeletal:        General: No deformity.  Neurological:     Mental Status: She is alert and oriented to person, place, and time.     Coordination: Coordination normal.  Psychiatric:        Attention and Perception: Attention and perception normal. She does not perceive auditory or visual hallucinations.        Mood and Affect: Mood normal. Mood is not anxious or depressed. Affect is not labile, blunt, angry or inappropriate.        Speech: Speech normal.        Behavior: Behavior normal.        Thought Content: Thought content normal. Thought content is not paranoid or delusional. Thought content does not include homicidal or suicidal ideation. Thought content does not include homicidal or suicidal plan.        Cognition and Memory: Cognition and memory normal.        Judgment: Judgment normal.     Comments: Insight intact     Lab Review:     Component Value Date/Time   NA 135 06/09/2017 1135   K 4.0 06/09/2017 1135   CL 101 06/09/2017 1135   CO2 28 06/09/2017 1135   GLUCOSE 83 06/09/2017 1135   BUN 12 06/09/2017 1135   CREATININE 0.88 06/09/2017 1135   CREATININE 0.80 05/10/2013 1451   CALCIUM 8.8 06/09/2017 1135   PROT 7.9 03/22/2017 1253   ALBUMIN 4.6 03/22/2017 1253   AST 10 03/22/2017 1253    ALT 10 03/22/2017 1253   ALKPHOS 55 03/22/2017 1253   BILITOT 0.7 03/22/2017 1253   GFRNONAA >89 05/10/2013 1451   GFRAA >89 05/10/2013 1451       Component Value Date/Time   WBC 7.0 03/22/2017 1253   RBC 4.58 03/22/2017 1253   HGB 13.8 03/22/2017 1253   HCT 41.5 03/22/2017 1253   PLT 276.0 03/22/2017 1253   MCV 90.7 03/22/2017 1253   MCH 29.5 11/29/2016 1622   MCHC  33.2 03/22/2017 1253   RDW 13.5 03/22/2017 1253   LYMPHSABS 2.3 03/22/2017 1253   MONOABS 0.6 03/22/2017 1253   EOSABS 0.1 03/22/2017 1253   BASOSABS 0.0 03/22/2017 1253    No results found for: "POCLITH", "LITHIUM"   No results found for: "PHENYTOIN", "PHENOBARB", "VALPROATE", "CBMZ"   .res Assessment: Plan:    Plan:  PDMP reviewed  Restart Hydroxzine '50mg'$  TID  Increase Vyvanse '10mg'$  to '20mg'$  daily.  Pristiq '25mg'$  daily - discussed tapering off medication.  RTC 6 months  Patient advised to contact office with any questions, adverse effects, or acute worsening in signs and symptoms.  Discussed potential metabolic side effects associated with atypical antipsychotics, as well as potential risk for movement side effects. Advised pt to contact office if movement side effects occur.   Discussed potential benefits, risks, and side effects of stimulants with patient to include increased heart rate, palpitations, insomnia, increased anxiety, increased irritability, or decreased appetite.  Instructed patient to contact office if experiencing any significant tolerability issues.  There are no diagnoses linked to this encounter.   Please see After Visit Summary for patient specific instructions.  No future appointments.  No orders of the defined types were placed in this encounter.   -------------------------------

## 2022-09-08 ENCOUNTER — Other Ambulatory Visit: Payer: Self-pay | Admitting: Adult Health

## 2022-09-08 DIAGNOSIS — F411 Generalized anxiety disorder: Secondary | ICD-10-CM

## 2022-09-08 DIAGNOSIS — G4700 Insomnia, unspecified: Secondary | ICD-10-CM

## 2022-11-17 ENCOUNTER — Telehealth: Payer: Self-pay | Admitting: Adult Health

## 2022-11-17 NOTE — Telephone Encounter (Signed)
CMM sent [place] renewal request for Vyvanse 20mg   see CMM

## 2022-11-17 NOTE — Telephone Encounter (Signed)
Prior Authorization Vyvanse 20 mg  #30/30 [employer]

## 2022-11-18 NOTE — Telephone Encounter (Signed)
Prior Approval received through 11/16/2025 with [employer] for Vyvanse 20 mg

## 2023-01-25 ENCOUNTER — Other Ambulatory Visit: Payer: Self-pay

## 2023-01-25 ENCOUNTER — Telehealth: Payer: Self-pay | Admitting: Adult Health

## 2023-01-25 DIAGNOSIS — F909 Attention-deficit hyperactivity disorder, unspecified type: Secondary | ICD-10-CM

## 2023-01-25 MED ORDER — LISDEXAMFETAMINE DIMESYLATE 20 MG PO CAPS: 20.0000 mg | ORAL_CAPSULE | Freq: Every day | ORAL | 0 refills | Status: DC

## 2023-01-25 NOTE — Telephone Encounter (Signed)
Pended.

## 2023-01-25 NOTE — Telephone Encounter (Signed)
Pt LVM @ 8:19a requesting refill of Vyvanse to   [id number] - [place], [place] - [address], [place] [address] Phone: [phone]  Fax: [phone] DEA #: [id number]   Next appt 9/12

## 2023-03-01 ENCOUNTER — Other Ambulatory Visit: Payer: Self-pay | Admitting: Adult Health

## 2023-03-01 DIAGNOSIS — F331 Major depressive disorder, recurrent, moderate: Secondary | ICD-10-CM

## 2023-03-02 ENCOUNTER — Encounter: Payer: Self-pay | Admitting: Adult Health

## 2023-03-02 ENCOUNTER — Ambulatory Visit (INDEPENDENT_AMBULATORY_CARE_PROVIDER_SITE_OTHER): Payer: 59 | Admitting: Adult Health

## 2023-03-02 DIAGNOSIS — F331 Major depressive disorder, recurrent, moderate: Secondary | ICD-10-CM

## 2023-03-02 DIAGNOSIS — F4310 Post-traumatic stress disorder, unspecified: Secondary | ICD-10-CM

## 2023-03-02 DIAGNOSIS — F909 Attention-deficit hyperactivity disorder, unspecified type: Secondary | ICD-10-CM | POA: Diagnosis not present

## 2023-03-02 DIAGNOSIS — F411 Generalized anxiety disorder: Secondary | ICD-10-CM

## 2023-03-02 MED ORDER — LISDEXAMFETAMINE DIMESYLATE 20 MG PO CAPS
20.0000 mg | ORAL_CAPSULE | Freq: Every day | ORAL | 0 refills | Status: DC
Start: 2023-03-02 — End: 2023-05-15

## 2023-03-02 NOTE — Telephone Encounter (Signed)
Last note mentions tapering off, is she still taking?

## 2023-03-02 NOTE — Progress Notes (Signed)
Kristin Sandoval [id number] 1986-06-19 37 y.o.  Subjective:   Patient ID:  Kristin Sandoval is a 37 y.o. (DOB [birth year]) female.  Chief Complaint: No chief complaint on file.   HPI Kristin Sandoval presents to the office today for follow-up of GAD, insomnia, MDD, and PTSD.  Describes mood today as "ok". Pleasant. Mood symptoms - denies depression, anxiety or irritability. Denies panic attacks. Denies some worry, rumination and rumination. Mood is consistent. Stating "I'm doing alright". Has tapered off the Pristiq 25mg  daily. Feels like the Vyvanse 20mg  daily works well.  Stable interest and motivation. Taking medications as prescribed.  Energy levels lower. Active, does not have a regular exercise routine.  Enjoys some usual interests and activities. Single. Lives alone. Talking with friends.  Appetite adequate. Weight loss - 153 from 180 pounds.  Sleeps well most night. Averages 6 hours. Denies daytime napping. Focus and concentration stable. Completing tasks. Managing aspects of household. 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 Denies SI or HI.  Denies AH or VH. Denies self harm. Denies substance use. Maintains sobriety.  Previous medication trials: Wellbutrin - rash, Abilify, Risperdal, Wellbutrin, Alfonso Patten, Seoquel   PHQ2-9    Flowsheet Row Office Visit from 03/22/2017 in [facility] Office Visit from 03/04/2016 in [facility]  PHQ-2 Total Score 3 0  PHQ-9 Total Score 10 --        Review of Systems:  Review of Systems  Musculoskeletal:  Negative for gait problem.  Neurological:  Negative for tremors.  Psychiatric/Behavioral:         Please refer to HPI    Medications: I have reviewed the patient's current medications.  Current Outpatient Medications  Medication Sig Dispense Refill   albuterol (PROAIR HFA) 108 (90 Base) MCG/ACT inhaler INHALE 2 PUFFS INTO THE LUNGS EVERY  6 (SIX) HOURS AS NEEDED FOR WHEEZING OR SHORTNESS OF BREATH. 8.5 Inhaler 0   Cholecalciferol (VITAMIN D3) 1.25 MG (50000 UT) CAPS SMARTSIG:1 Capsule(s) By Mouth Once a Week PRN     desvenlafaxine (PRISTIQ) 25 MG 24 hr tablet Take 1 tablet (25 mg total) by mouth daily. 90 tablet 1   Desvenlafaxine Succinate ER (PRISTIQ) 25 MG TB24 Take 25 mg by mouth in the morning. 7 tablet 0   hydrOXYzine (VISTARIL) 25 MG capsule TAKE 2 CAPSULES BY MOUTH 3 TIMES DAILY AS NEEDED. 540 capsule 0   ibuprofen (ADVIL,MOTRIN) 600 MG tablet 1 tab po q8h prn at onset of headache 30 tablet 3   lisdexamfetamine (VYVANSE) 20 MG capsule Take 1 capsule (20 mg total) by mouth daily. 90 capsule 0   metroNIDAZOLE (METROCREAM) 0.75 % cream APPLY TO AFFECTED AREA EVERY DAY AS NEEDED     tretinoin (RETIN-A) 0.025 % cream APPLY TO AFFECTED AREA EVERY DAY AT BEDTIME     tretinoin (RETIN-A) 0.05 % cream Apply topically at bedtime. 45 g 0   valACYclovir (VALTREX) 1000 MG tablet      No current facility-administered medications for this visit.    Medication Side Effects: None  Allergies:  Allergies  Allergen Reactions   Apple Juice Shortness Of Breath   Acrylic Polymer [Carbomer]     Loses nail   Latex     Latex "mild skin irritation"   Other Other (See Comments)    STRAW / HAY.  SEVERE  ITCHING, CONGESTION.   Adhesive [Tape] Rash   Denture Adhesive Rash   Mango  Flavor Rash    Past Medical History:  Diagnosis Date   ADHD (attention deficit hyperactivity disorder)    Adderall used   Asthma    childhood   Complication of anesthesia    some difficulty awakening   Headache    migraines in past- none recent   Skin cancer    basal cell carcinoma    Past Medical History, Surgical history, Social history, and Family history were reviewed and updated as appropriate.   Please see review of systems for further details on the patient's review from today.   Objective:   Physical Exam:  There were no vitals taken for  this visit.  Physical Exam Constitutional:      General: She is not in acute distress. Musculoskeletal:        General: No deformity.  Neurological:     Mental Status: She is alert and oriented to person, place, and time.     Coordination: Coordination normal.  Psychiatric:        Attention and Perception: Attention and perception normal. She does not perceive auditory or visual hallucinations.        Mood and Affect: Mood normal. Mood is not anxious or depressed. Affect is not labile, blunt, angry or inappropriate.        Speech: Speech normal.        Behavior: Behavior normal.        Thought Content: Thought content normal. Thought content is not paranoid or delusional. Thought content does not include homicidal or suicidal ideation. Thought content does not include homicidal or suicidal plan.        Cognition and Memory: Cognition and memory normal.        Judgment: Judgment normal.     Comments: Insight intact     Lab Review:     Component Value Date/Time   NA 135 06/09/2017 1135   K 4.0 06/09/2017 1135   CL 101 06/09/2017 1135   CO2 28 06/09/2017 1135   GLUCOSE 83 06/09/2017 1135   BUN 12 06/09/2017 1135   CREATININE 0.88 06/09/2017 1135   CREATININE 0.80 05/10/2013 1451   CALCIUM 8.8 06/09/2017 1135   PROT 7.9 03/22/2017 1253   ALBUMIN 4.6 03/22/2017 1253   AST 10 03/22/2017 1253   ALT 10 03/22/2017 1253   ALKPHOS 55 03/22/2017 1253   BILITOT 0.7 03/22/2017 1253   GFRNONAA >89 05/10/2013 1451   GFRAA >89 05/10/2013 1451       Component Value Date/Time   WBC 7.0 03/22/2017 1253   RBC 4.58 03/22/2017 1253   HGB 13.8 03/22/2017 1253   HCT 41.5 03/22/2017 1253   PLT 276.0 03/22/2017 1253   MCV 90.7 03/22/2017 1253   MCH 29.5 11/29/2016 1622   MCHC 33.2 03/22/2017 1253   RDW 13.5 03/22/2017 1253   LYMPHSABS 2.3 03/22/2017 1253   MONOABS 0.6 03/22/2017 1253   EOSABS 0.1 03/22/2017 1253   BASOSABS 0.0 03/22/2017 1253    No results found for: "POCLITH",  "LITHIUM"   No results found for: "PHENYTOIN", "PHENOBARB", "VALPROATE", "CBMZ"   .res Assessment: Plan:    Plan:  PDMP reviewed  Hydroxzine 50mg  TID   Vyvanse 20mg  daily.   RTC 6 months  Patient advised to contact office with any questions, adverse effects, or acute worsening in signs and symptoms.  Discussed potential metabolic side effects associated with atypical antipsychotics, as well as potential risk for movement side effects. Advised pt to contact office if movement side effects occur.  Discussed potential benefits, risks, and side effects of stimulants with patient to include increased heart rate, palpitations, insomnia, increased anxiety, increased irritability, or decreased appetite.  Instructed patient to contact office if experiencing any significant tolerability issues.  There are no diagnoses linked to this encounter.   Please see After Visit Summary for patient specific instructions.  Future Appointments  Date Time Provider Department Center  03/02/2023  1:00 PM Mckenna Gamm, Thereasa Solo, NP [facility] None    No orders of the defined types were placed in this encounter.   -------------------------------

## 2023-03-23 ENCOUNTER — Other Ambulatory Visit: Payer: Self-pay | Admitting: Adult Health

## 2023-03-23 DIAGNOSIS — F331 Major depressive disorder, recurrent, moderate: Secondary | ICD-10-CM

## 2023-03-25 NOTE — Telephone Encounter (Signed)
Was going to taper off ?

## 2023-03-27 NOTE — Telephone Encounter (Signed)
Tried to call patient and mailbox was full.

## 2023-05-15 ENCOUNTER — Other Ambulatory Visit: Payer: Self-pay

## 2023-05-15 ENCOUNTER — Telehealth: Payer: Self-pay | Admitting: Adult Health

## 2023-05-15 DIAGNOSIS — F909 Attention-deficit hyperactivity disorder, unspecified type: Secondary | ICD-10-CM

## 2023-05-15 MED ORDER — LISDEXAMFETAMINE DIMESYLATE 20 MG PO CAPS
20.0000 mg | ORAL_CAPSULE | Freq: Every day | ORAL | 0 refills | Status: DC
Start: 2023-05-15 — End: 2023-08-30

## 2023-05-15 NOTE — Telephone Encounter (Signed)
Kristin Sandoval called at 9:15 to request refill of her vyvanse.  Appt 08/30/23. Sent to  [id number] - [place], [place] - [address]

## 2023-05-15 NOTE — Telephone Encounter (Signed)
Pended Vyvanse 20 mg to requested pharmacy.

## 2023-08-30 ENCOUNTER — Telehealth: Payer: 59 | Admitting: Adult Health

## 2023-08-30 ENCOUNTER — Ambulatory Visit: Payer: 59 | Admitting: Adult Health

## 2023-08-30 ENCOUNTER — Encounter: Payer: Self-pay | Admitting: Adult Health

## 2023-08-30 DIAGNOSIS — F331 Major depressive disorder, recurrent, moderate: Secondary | ICD-10-CM | POA: Diagnosis not present

## 2023-08-30 DIAGNOSIS — F411 Generalized anxiety disorder: Secondary | ICD-10-CM

## 2023-08-30 DIAGNOSIS — F4310 Post-traumatic stress disorder, unspecified: Secondary | ICD-10-CM

## 2023-08-30 DIAGNOSIS — G4700 Insomnia, unspecified: Secondary | ICD-10-CM

## 2023-08-30 DIAGNOSIS — F909 Attention-deficit hyperactivity disorder, unspecified type: Secondary | ICD-10-CM | POA: Diagnosis not present

## 2023-08-30 MED ORDER — LISDEXAMFETAMINE DIMESYLATE 20 MG PO CAPS
20.0000 mg | ORAL_CAPSULE | Freq: Every day | ORAL | 0 refills | Status: DC
Start: 2023-08-30 — End: 2024-01-02

## 2023-08-30 NOTE — Progress Notes (Signed)
 Kristin Sandoval [id number] August 19, 1985 38 y.o.  Virtual Visit via Video Note  I connected with pt @ on 08/30/23 at  1:00 PM EDT by a video enabled telemedicine application and verified that I am speaking with the correct person using two identifiers.   I discussed the limitations of evaluation and management by telemedicine and the availability of in person appointments. The patient expressed understanding and agreed to proceed.  I discussed the assessment and treatment plan with the patient. The patient was provided an opportunity to ask questions and all were answered. The patient agreed with the plan and demonstrated an understanding of the instructions.   The patient was advised to call back or seek an in-person evaluation if the symptoms worsen or if the condition fails to improve as anticipated.  I provided 25 minutes of non-face-to-face time during this encounter.  The patient was located at home.  The provider was located at [facility].   Dorothyann Gibbs, NP   Subjective:   Patient ID:  Kristin Sandoval is a 38 y.o. (DOB 1986-06-16) female.  Chief Complaint: No chief complaint on file.   HPI Kristin Sandoval presents for follow-up of GAD, insomnia, MDD, ADD and PTSD.  Describes mood today as "ok". Pleasant. Mood symptoms - denies depression and anxiety. Reports stable interest and motivation. Reports some irritability and agitation. Denies panic attacks. Reports worry, rumination and and over thinking. Denies obsessive thoughts and acts. Mood is consistent. Stating "I feel like I'm doing alright". Feels like the Vyvanse 20mg  daily works well.  Taking medications as prescribed.  Energy levels lower. Active, does not have a regular exercise routine.  Enjoys some usual interests and activities. Single. Lives alone. Talking with friends.  Appetite adequate. Weight loss - 142 pounds.  Sleeps well most night. Averages 6 hours. Denies daytime napping. Focus and  concentration stable. Completing tasks. Managing aspects of household. 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 Denies SI or HI.  Denies AH or VH. Denies self harm. Denies substance use. Maintains sobriety.  Previous medication trials: Wellbutrin - rash, Abilify, Risperdal, Wellbutrin, Lunesta, Seoquel   Review of Systems:  Review of Systems  Musculoskeletal:  Negative for gait problem.  Neurological:  Negative for tremors.  Psychiatric/Behavioral:         Please refer to HPI    Medications: I have reviewed the patient's current medications.  Current Outpatient Medications  Medication Sig Dispense Refill   albuterol (PROAIR HFA) 108 (90 Base) MCG/ACT inhaler INHALE 2 PUFFS INTO THE LUNGS EVERY 6 (SIX) HOURS AS NEEDED FOR WHEEZING OR SHORTNESS OF BREATH. 8.5 Inhaler 0   Cholecalciferol (VITAMIN D3) 1.25 MG (50000 UT) CAPS SMARTSIG:1 Capsule(s) By Mouth Once a Week PRN     desvenlafaxine (PRISTIQ) 25 MG 24 hr tablet TAKE 1 TABLET (25 MG TOTAL) BY MOUTH DAILY. 30 tablet 0   Desvenlafaxine Succinate ER (PRISTIQ) 25 MG TB24 Take 25 mg by mouth in the morning. 7 tablet 0   hydrOXYzine (VISTARIL) 25 MG capsule TAKE 2 CAPSULES BY MOUTH 3 TIMES DAILY AS NEEDED. 540 capsule 0   ibuprofen (ADVIL,MOTRIN) 600 MG tablet 1 tab po q8h prn at onset of headache 30 tablet 3   lisdexamfetamine (VYVANSE) 20 MG capsule Take 1 capsule (20 mg total) by mouth daily. 90 capsule 0   metroNIDAZOLE (METROCREAM) 0.75 % cream APPLY TO AFFECTED AREA EVERY DAY AS NEEDED     tretinoin (RETIN-A) 0.025 % cream APPLY TO AFFECTED AREA EVERY  DAY AT BEDTIME     tretinoin (RETIN-A) 0.05 % cream Apply topically at bedtime. 45 g 0   valACYclovir (VALTREX) 1000 MG tablet      No current facility-administered medications for this visit.    Medication Side Effects: None  Allergies:  Allergies  Allergen Reactions   Apple Juice Shortness Of Breath   Acrylic Polymer [Carbomer]     Loses nail   Latex     Latex  "mild skin irritation"   Other Other (See Comments)    STRAW / HAY.  SEVERE  ITCHING, CONGESTION.   Adhesive [Tape] Rash   Denture Adhesive Rash   Mango Flavoring Agent (Non-Screening) Rash    Past Medical History:  Diagnosis Date   ADHD (attention deficit hyperactivity disorder)    Adderall used   Asthma    childhood   Complication of anesthesia    some difficulty awakening   Headache    migraines in past- none recent   Skin cancer    basal cell carcinoma    Family History  Problem Relation Age of Onset   Alcohol abuse Father    Arthritis Maternal Uncle    Hyperlipidemia Maternal Uncle    Alcohol abuse Paternal Aunt    Arthritis Maternal Grandmother        DDD, spinal stenosis   Hyperlipidemia Maternal Grandmother    Alcohol abuse Maternal Grandfather    Alcohol abuse Paternal Grandfather     Social History   Socioeconomic History   Marital status: Single    Spouse name: Not on file   Number of children: 0   Years of education: Not on file   Highest education level: Not on file  Occupational History    Employer: [employer]  Tobacco Use   Smoking status: Former    Current packs/day: 0.00    Types: Cigarettes    Quit date: 06/30/2006    Years since quitting: 17.1   Smokeless tobacco: Never  Substance and Sexual Activity   Alcohol use: Yes    Comment: 1-3 drinks weekly   Drug use: No   Sexual activity: Yes    Birth control/protection: Pill  Other Topics Concern   Not on file  Social History Narrative   Caffeine Use:  1 cup coffee daily   Regular exercise:  4 x weekly         Social Drivers of Corporate investment banker Strain: Not on file  Food Insecurity: Not on file  Transportation Needs: Not on file  Physical Activity: Not on file  Stress: Not on file  Social Connections: Not on file  Intimate Partner Violence: Not on file    Past Medical History, Surgical history, Social history, and Family history were reviewed and updated as appropriate.    Please see review of systems for further details on the patient's review from today.   Objective:   Physical Exam:  There were no vitals taken for this visit.  Physical Exam Constitutional:      General: She is not in acute distress. Musculoskeletal:        General: No deformity.  Neurological:     Mental Status: She is alert and oriented to person, place, and time.     Coordination: Coordination normal.  Psychiatric:        Attention and Perception: Attention and perception normal. She does not perceive auditory or visual hallucinations.        Mood and Affect: Affect is not labile, blunt, angry or  inappropriate.        Speech: Speech normal.        Behavior: Behavior normal.        Thought Content: Thought content normal. Thought content is not paranoid or delusional. Thought content does not include homicidal or suicidal ideation. Thought content does not include homicidal or suicidal plan.        Cognition and Memory: Cognition and memory normal.        Judgment: Judgment normal.     Comments: Insight intact     Lab Review:     Component Value Date/Time   NA 135 06/09/2017 1135   K 4.0 06/09/2017 1135   CL 101 06/09/2017 1135   CO2 28 06/09/2017 1135   GLUCOSE 83 06/09/2017 1135   BUN 12 06/09/2017 1135   CREATININE 0.88 06/09/2017 1135   CREATININE 0.80 05/10/2013 1451   CALCIUM 8.8 06/09/2017 1135   PROT 7.9 03/22/2017 1253   ALBUMIN 4.6 03/22/2017 1253   AST 10 03/22/2017 1253   ALT 10 03/22/2017 1253   ALKPHOS 55 03/22/2017 1253   BILITOT 0.7 03/22/2017 1253   GFRNONAA >89 05/10/2013 1451   GFRAA >89 05/10/2013 1451       Component Value Date/Time   WBC 7.0 03/22/2017 1253   RBC 4.58 03/22/2017 1253   HGB 13.8 03/22/2017 1253   HCT 41.5 03/22/2017 1253   PLT 276.0 03/22/2017 1253   MCV 90.7 03/22/2017 1253   MCH 29.5 11/29/2016 1622   MCHC 33.2 03/22/2017 1253   RDW 13.5 03/22/2017 1253   LYMPHSABS 2.3 03/22/2017 1253   MONOABS 0.6 03/22/2017  1253   EOSABS 0.1 03/22/2017 1253   BASOSABS 0.0 03/22/2017 1253    No results found for: "POCLITH", "LITHIUM"   No results found for: "PHENYTOIN", "PHENOBARB", "VALPROATE", "CBMZ"   .res Assessment: Plan:    Plan:  PDMP reviewed  Vyvanse 20mg  daily.   RTC 5 months  25 minutes spent dedicated to the care of this patient on the date of this encounter to include pre-visit review of records, ordering of medication, post visit documentation, and face-to-face time with the patient discussing GAD, insomnia, MDD, and PTSD. Discussed continuing current medication regimen.  Patient advised to contact office with any questions, adverse effects, or acute worsening in signs and symptoms.  Discussed potential metabolic side effects associated with atypical antipsychotics, as well as potential risk for movement side effects. Advised pt to contact office if movement side effects occur.   Discussed potential benefits, risks, and side effects of stimulants with patient to include increased heart rate, palpitations, insomnia, increased anxiety, increased irritability, or decreased appetite.  Instructed patient to contact office if experiencing any significant tolerability issues.  There are no diagnoses linked to this encounter.   Please see After Visit Summary for patient specific instructions.  Future Appointments  Date Time Provider Department Center  08/30/2023  1:00 PM Demarius Archila, Thereasa Solo, NP [facility] None    No orders of the defined types were placed in this encounter.     -------------------------------

## 2024-01-02 ENCOUNTER — Other Ambulatory Visit: Payer: Self-pay

## 2024-01-02 ENCOUNTER — Telehealth: Payer: Self-pay | Admitting: Adult Health

## 2024-01-02 DIAGNOSIS — F909 Attention-deficit hyperactivity disorder, unspecified type: Secondary | ICD-10-CM

## 2024-01-02 MED ORDER — LISDEXAMFETAMINE DIMESYLATE 20 MG PO CAPS: 20.0000 mg | ORAL_CAPSULE | Freq: Every day | ORAL | 0 refills | Status: AC

## 2024-01-02 NOTE — Telephone Encounter (Signed)
 Pended.

## 2024-01-02 NOTE — Telephone Encounter (Signed)
 Pt needs refill on vyvanse    [facility]   Next Appt 7/31

## 2024-01-18 ENCOUNTER — Telehealth: Admitting: Adult Health

## 2024-01-18 ENCOUNTER — Encounter: Payer: Self-pay | Admitting: Adult Health

## 2024-01-18 DIAGNOSIS — F909 Attention-deficit hyperactivity disorder, unspecified type: Secondary | ICD-10-CM | POA: Diagnosis not present

## 2024-01-18 DIAGNOSIS — F4310 Post-traumatic stress disorder, unspecified: Secondary | ICD-10-CM

## 2024-01-18 DIAGNOSIS — G4700 Insomnia, unspecified: Secondary | ICD-10-CM | POA: Diagnosis not present

## 2024-01-18 DIAGNOSIS — F411 Generalized anxiety disorder: Secondary | ICD-10-CM

## 2024-01-18 DIAGNOSIS — F331 Major depressive disorder, recurrent, moderate: Secondary | ICD-10-CM

## 2024-01-18 NOTE — Progress Notes (Signed)
 Kristin Sandoval [id number] [birth year] 38 y.o.  Virtual Visit via Video Note  I connected with pt @ on 01/18/24 at 12:00 PM EDT by a video enabled telemedicine application and verified that I am speaking with the correct person using two identifiers.   I discussed the limitations of evaluation and management by telemedicine and the availability of in person appointments. The patient expressed understanding and agreed to proceed.  I discussed the assessment and treatment plan with the patient. The patient was provided an opportunity to ask questions and all were answered. The patient agreed with the plan and demonstrated an understanding of the instructions.   The patient was advised to call back or seek an in-person evaluation if the symptoms worsen or if the condition fails to improve as anticipated.  I provided 10 minutes of non-face-to-face time during this encounter.  The patient was located at home.  The provider was located at [facility].   Kristin LOISE Sayers, NP   Subjective:   Patient ID:  Kristin Sandoval is a 38 y.o. (DOB 1985/08/29) female.  Chief Complaint: No chief complaint on file.   HPI Kristin Sandoval presents for follow-up of GAD, insomnia, MDD, ADD and PTSD.  Describes mood today as ok. Pleasant. Reports tearfulness at times. Mood symptoms - denies depression and anxiety. Reports stable interest and motivation. Reports some irritability and agitation when she gets dysregulated. Denies panic attacks. Reports worry, rumination and and over thinking. Denies obsessive thoughts and acts. Reports mood is variable. Stating I feel like I'm scattered. Feels like the Vyvanse  20mg  daily works well. Taking medications as prescribed.  Energy levels stable. Active, has a regular exercise routine.  Enjoys some usual interests and activities. Single. Lives alone. Talking with friends. Riding horse. Appetite adequate. Weight loss - 142 pounds.  Sleeps better some nights  than others. Averages 5 hours. Denies daytime napping. Focus and concentration stable. Completing tasks. Managing aspects of household. 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 Denies SI or HI.  Denies AH or VH. Denies self harm. Denies substance use. Maintains sobriety.  Previous medication trials: Wellbutrin  - rash, Abilify , Risperdal , Wellbutrin , Lunesta , Seoquel   Review of Systems:  Review of Systems  Musculoskeletal:  Negative for gait problem.  Neurological:  Negative for tremors.  Psychiatric/Behavioral:         Please refer to HPI    Medications: I have reviewed the patient's current medications.  Current Outpatient Medications  Medication Sig Dispense Refill   albuterol  (PROAIR  HFA) 108 (90 Base) MCG/ACT inhaler INHALE 2 PUFFS INTO THE LUNGS EVERY 6 (SIX) HOURS AS NEEDED FOR WHEEZING OR SHORTNESS OF BREATH. 8.5 Inhaler 0   Cholecalciferol (VITAMIN D3) 1.25 MG (50000 UT) CAPS SMARTSIG:1 Capsule(s) By Mouth Once a Week PRN     ibuprofen  (ADVIL ,MOTRIN ) 600 MG tablet 1 tab po q8h prn at onset of headache 30 tablet 3   lisdexamfetamine (VYVANSE ) 20 MG capsule Take 1 capsule (20 mg total) by mouth daily. 90 capsule 0   metroNIDAZOLE (METROCREAM) 0.75 % cream APPLY TO AFFECTED AREA EVERY DAY AS NEEDED     tretinoin  (RETIN-A ) 0.025 % cream APPLY TO AFFECTED AREA EVERY DAY AT BEDTIME     tretinoin  (RETIN-A ) 0.05 % cream Apply topically at bedtime. 45 g 0   valACYclovir  (VALTREX ) 1000 MG tablet      No current facility-administered medications for this visit.    Medication Side Effects: None  Allergies:  Allergies  Allergen Reactions  Apple Juice Shortness Of Breath   Acrylic Polymer [Carbomer]     Loses nail   Latex     Latex mild skin irritation   Other Other (See Comments)    STRAW / HAY.  SEVERE  ITCHING, CONGESTION.   Adhesive [Tape] Rash   Denture Adhesive Rash   Mango Flavoring Agent (Non-Screening) Rash    Past Medical History:  Diagnosis Date    ADHD (attention deficit hyperactivity disorder)    Adderall used   Asthma    childhood   Complication of anesthesia    some difficulty awakening   Headache    migraines in past- none recent   Skin cancer    basal cell carcinoma    Family History  Problem Relation Age of Onset   Alcohol abuse Father    Arthritis Maternal Uncle    Hyperlipidemia Maternal Uncle    Alcohol abuse Paternal Aunt    Arthritis Maternal Grandmother        DDD, spinal stenosis   Hyperlipidemia Maternal Grandmother    Alcohol abuse Maternal Grandfather    Alcohol abuse Paternal Grandfather     Social History   Socioeconomic History   Marital status: Single    Spouse name: Not on file   Number of children: 0   Years of education: Not on file   Highest education level: Not on file  Occupational History    Employer: [employer]  Tobacco Use   Smoking status: Former    Current packs/day: 0.00    Types: Cigarettes    Quit date: 06/30/2006    Years since quitting: 17.5   Smokeless tobacco: Never  Substance and Sexual Activity   Alcohol use: Yes    Comment: 1-3 drinks weekly   Drug use: No   Sexual activity: Yes    Birth control/protection: Pill  Other Topics Concern   Not on file  Social History Narrative   Caffeine Use:  1 cup coffee daily   Regular exercise:  4 x weekly         Social Drivers of Corporate investment banker Strain: Not on file  Food Insecurity: Not on file  Transportation Needs: Not on file  Physical Activity: Not on file  Stress: Not on file  Social Connections: Not on file  Intimate Partner Violence: Not on file    Past Medical History, Surgical history, Social history, and Family history were reviewed and updated as appropriate.   Please see review of systems for further details on the patient's review from today.   Objective:   Physical Exam:  There were no vitals taken for this visit.  Physical Exam Constitutional:      General: She is not in acute  distress. Musculoskeletal:        General: No deformity.  Neurological:     Mental Status: She is alert and oriented to person, place, and time.     Coordination: Coordination normal.  Psychiatric:        Attention and Perception: Attention and perception normal. She does not perceive auditory or visual hallucinations.        Mood and Affect: Mood normal. Mood is not anxious or depressed. Affect is not labile, blunt, angry or inappropriate.        Speech: Speech normal.        Behavior: Behavior normal.        Thought Content: Thought content normal. Thought content is not paranoid or delusional. Thought content does not include homicidal  or suicidal ideation. Thought content does not include homicidal or suicidal plan.        Cognition and Memory: Cognition and memory normal.        Judgment: Judgment normal.     Comments: Insight intact     Lab Review:     Component Value Date/Time   NA 135 06/09/2017 1135   K 4.0 06/09/2017 1135   CL 101 06/09/2017 1135   CO2 28 06/09/2017 1135   GLUCOSE 83 06/09/2017 1135   BUN 12 06/09/2017 1135   CREATININE 0.88 06/09/2017 1135   CREATININE 0.80 05/10/2013 1451   CALCIUM 8.8 06/09/2017 1135   PROT 7.9 03/22/2017 1253   ALBUMIN 4.6 03/22/2017 1253   AST 10 03/22/2017 1253   ALT 10 03/22/2017 1253   ALKPHOS 55 03/22/2017 1253   BILITOT 0.7 03/22/2017 1253   GFRNONAA >89 05/10/2013 1451   GFRAA >89 05/10/2013 1451       Component Value Date/Time   WBC 7.0 03/22/2017 1253   RBC 4.58 03/22/2017 1253   HGB 13.8 03/22/2017 1253   HCT 41.5 03/22/2017 1253   PLT 276.0 03/22/2017 1253   MCV 90.7 03/22/2017 1253   MCH 29.5 11/29/2016 1622   MCHC 33.2 03/22/2017 1253   RDW 13.5 03/22/2017 1253   LYMPHSABS 2.3 03/22/2017 1253   MONOABS 0.6 03/22/2017 1253   EOSABS 0.1 03/22/2017 1253   BASOSABS 0.0 03/22/2017 1253    No results found for: POCLITH, LITHIUM   No results found for: PHENYTOIN, PHENOBARB, VALPROATE, CBMZ    .res Assessment: Plan:    Plan:  PDMP reviewed  Vyvanse  20mg  daily.   RTC 2 months  25 minutes spent dedicated to the care of this patient on the date of this encounter to include pre-visit review of records, ordering of medication, post visit documentation, and face-to-face time with the patient discussing ADD. Discussed continuing current medication regimen.  Patient advised to contact office with any questions, adverse effects, or acute worsening in signs and symptoms.  Discussed potential metabolic side effects associated with atypical antipsychotics, as well as potential risk for movement side effects. Advised pt to contact office if movement side effects occur.   Discussed potential benefits, risks, and side effects of stimulants with patient to include increased heart rate, palpitations, insomnia, increased anxiety, increased irritability, or decreased appetite.  Instructed patient to contact office if experiencing any significant tolerability issues. There are no diagnoses linked to this encounter.   Please see After Visit Summary for patient specific instructions.  No future appointments.  No orders of the defined types were placed in this encounter.     -------------------------------
